# Patient Record
Sex: Female | Born: 1990 | Race: Black or African American | Hispanic: No | Marital: Single | Smoking: Current some day smoker
Health system: Southern US, Community
[De-identification: ages and names within clinical notes are randomized; demographics above are authoritative.]

## PROBLEM LIST (undated history)

## (undated) DIAGNOSIS — J45909 Unspecified asthma, uncomplicated: Secondary | ICD-10-CM

## (undated) DIAGNOSIS — N6019 Diffuse cystic mastopathy of unspecified breast: Secondary | ICD-10-CM

## (undated) DIAGNOSIS — L309 Dermatitis, unspecified: Secondary | ICD-10-CM

## (undated) DIAGNOSIS — A4902 Methicillin resistant Staphylococcus aureus infection, unspecified site: Secondary | ICD-10-CM

## (undated) HISTORY — PX: NO PAST SURGERIES: SHX2092

---

## 2012-01-15 ENCOUNTER — Emergency Department (HOSPITAL_BASED_OUTPATIENT_CLINIC_OR_DEPARTMENT_OTHER)
Admission: EM | Admit: 2012-01-15 | Discharge: 2012-01-15 | Disposition: A | Discharge status: Home / Self Care | Payer: Medicaid Other | Attending: Emergency Medicine | Admitting: Emergency Medicine

## 2012-01-15 ENCOUNTER — Encounter (HOSPITAL_BASED_OUTPATIENT_CLINIC_OR_DEPARTMENT_OTHER): Payer: Self-pay | Admitting: *Deleted

## 2012-01-15 DIAGNOSIS — N611 Abscess of the breast and nipple: Secondary | ICD-10-CM

## 2012-01-15 DIAGNOSIS — N61 Mastitis without abscess: Secondary | ICD-10-CM

## 2012-01-15 DIAGNOSIS — F17200 Nicotine dependence, unspecified, uncomplicated: Secondary | ICD-10-CM | POA: Insufficient documentation

## 2012-01-15 DIAGNOSIS — N644 Mastodynia: Secondary | ICD-10-CM | POA: Insufficient documentation

## 2012-01-15 DIAGNOSIS — Z8614 Personal history of Methicillin resistant Staphylococcus aureus infection: Secondary | ICD-10-CM | POA: Insufficient documentation

## 2012-01-15 HISTORY — DX: Methicillin resistant Staphylococcus aureus infection, unspecified site: A49.02

## 2012-01-15 HISTORY — DX: Unspecified asthma, uncomplicated: J45.909

## 2012-01-15 HISTORY — DX: Dermatitis, unspecified: L30.9

## 2012-01-15 MED ORDER — CEPHALEXIN 500 MG PO CAPS: 500.0000 mg | ORAL_CAPSULE | Freq: Four times a day (QID) | ORAL | Status: AC

## 2012-01-15 MED ORDER — DOXYCYCLINE HYCLATE 100 MG PO TABS
100.0000 mg | ORAL_TABLET | Freq: Once | ORAL | Status: AC
  Administered 2012-01-15: 100 mg via ORAL

## 2012-01-15 MED ORDER — DOXYCYCLINE HYCLATE 100 MG PO TABS
ORAL_TABLET | ORAL | Status: AC
  Administered 2012-01-15: 100 mg via ORAL
  Filled 2012-01-15: qty 1

## 2012-01-15 MED ORDER — DOXYCYCLINE HYCLATE 100 MG IV SOLR: 100.0000 mg | Freq: Once | INTRAVENOUS | Status: DC

## 2012-01-15 MED ORDER — CEPHALEXIN 250 MG PO CAPS
500.0000 mg | ORAL_CAPSULE | Freq: Once | ORAL | Status: AC
  Administered 2012-01-15: 500 mg via ORAL
  Filled 2012-01-15: qty 2

## 2012-01-15 MED ORDER — DOXYCYCLINE HYCLATE 100 MG PO CAPS: 100.0000 mg | ORAL_CAPSULE | Freq: Two times a day (BID) | ORAL | Status: AC

## 2012-01-15 MED ORDER — TRAMADOL HCL 50 MG PO TABS: 50.0000 mg | ORAL_TABLET | Freq: Four times a day (QID) | ORAL | Status: AC | PRN

## 2012-01-15 NOTE — ED Notes (Signed)
Pt c/o red swollen area to left breast with drainage, denies fever

## 2012-01-15 NOTE — ED Notes (Signed)
Pt called requesting follow up information. Information given.

## 2012-01-15 NOTE — ED Notes (Signed)
MD at bedside. 

## 2012-01-15 NOTE — ED Provider Notes (Addendum)
History     CSN: [id number]  Arrival date & time 01/15/12  0241   None     Chief Complaint  Patient presents with  . Abscess    (Consider location/radiation/quality/duration/timing/severity/associated sxs/prior treatment) The history is provided by the patient. No language interpreter was used.  Patient has had left breast pain and swelling starting yesterday.  She is not nursing.  No f/c/r.  No n/v/d.  Patient has had breast redness in the past.  No rashes but the breast is severely painful to the touch and is red at the nipple and around the areola  Past Medical History  Diagnosis Date  . Asthma   . Eczema   . MRSA (methicillin resistant Staphylococcus aureus)     History reviewed. No pertinent past surgical history.  History reviewed. No pertinent family history.  History  Substance Use Topics  . Smoking status: Current Everyday Smoker  . Smokeless tobacco: Not on file  . Alcohol Use: No    OB History    Grav Para Term Preterm Abortions TAB SAB Ect Mult Living                  Review of Systems  Constitutional: Negative for fever.  HENT: Negative for neck pain and neck stiffness.   All other systems reviewed and are negative.    Allergies  Sulfa antibiotics  Home Medications  No current outpatient prescriptions on file.  BP 109/68  Pulse 80  Temp(Src) 98 F (36.7 C) (Oral)  Resp 16  Ht 5\' 7"  (1.702 m)  Wt 130 lb (58.968 kg)  BMI 20.36 kg/m2  SpO2 100%  LMP 01/03/2012  Physical Exam  Constitutional: She is oriented to person, place, and time. She appears well-developed and well-nourished. No distress.  HENT:  Head: Normocephalic and atraumatic.  Mouth/Throat: Oropharynx is clear and moist.  Eyes: Conjunctivae are normal. Pupils are equal, round, and reactive to light.  Neck: Normal range of motion. Neck supple.       No lymphadenopathy of the supraclavicular area or axilla no epitrochlear nodes  Cardiovascular: Normal rate and regular rhythm.    Pulmonary/Chest: Effort normal and breath sounds normal. She has no wheezes. She has no rales. Left breast exhibits inverted nipple, skin change and tenderness.    Abdominal: Soft. Bowel sounds are normal. There is no tenderness.  Musculoskeletal: Normal range of motion.  Lymphadenopathy:    She has no cervical adenopathy.  Neurological: She is alert and oriented to person, place, and time.  Skin: Skin is warm and dry.  Psychiatric: She has a normal mood and affect.    ED Course  Procedures (including critical care time)  Labs Reviewed - No data to display No results found.   No diagnosis found.    MDM  Will place on antibiotics and have patient to follow up with surgery and breast center.  Patient will need imaging and drainage.  Patient and mother verbalize understanding and agree to follow up        Mahalie Kanner K Meiling Hendriks-Rasch, MD 01/15/12 0328  Nayzeth Altman K Elesha Thedford-Rasch, MD 01/15/12 [phone]

## 2012-01-18 ENCOUNTER — Other Ambulatory Visit: Payer: Self-pay | Admitting: *Deleted

## 2012-01-18 DIAGNOSIS — N644 Mastodynia: Secondary | ICD-10-CM

## 2012-01-18 DIAGNOSIS — N63 Unspecified lump in unspecified breast: Secondary | ICD-10-CM

## 2012-02-19 ENCOUNTER — Other Ambulatory Visit: Payer: Self-pay | Admitting: Emergency Medicine

## 2012-02-19 DIAGNOSIS — N63 Unspecified lump in unspecified breast: Secondary | ICD-10-CM

## 2012-02-19 DIAGNOSIS — N644 Mastodynia: Secondary | ICD-10-CM

## 2012-03-03 ENCOUNTER — Other Ambulatory Visit: Payer: Medicaid Other

## 2012-04-07 ENCOUNTER — Emergency Department (HOSPITAL_BASED_OUTPATIENT_CLINIC_OR_DEPARTMENT_OTHER)
Admission: EM | Admit: 2012-04-07 | Discharge: 2012-04-07 | Disposition: A | Discharge status: Home / Self Care | Payer: No Typology Code available for payment source | Attending: Emergency Medicine | Admitting: Emergency Medicine

## 2012-04-07 ENCOUNTER — Encounter (HOSPITAL_BASED_OUTPATIENT_CLINIC_OR_DEPARTMENT_OTHER): Payer: Self-pay | Admitting: Emergency Medicine

## 2012-04-07 DIAGNOSIS — F17200 Nicotine dependence, unspecified, uncomplicated: Secondary | ICD-10-CM | POA: Insufficient documentation

## 2012-04-07 DIAGNOSIS — J45909 Unspecified asthma, uncomplicated: Secondary | ICD-10-CM | POA: Insufficient documentation

## 2012-04-07 DIAGNOSIS — Z882 Allergy status to sulfonamides status: Secondary | ICD-10-CM | POA: Insufficient documentation

## 2012-04-07 DIAGNOSIS — S5010XA Contusion of unspecified forearm, initial encounter: Secondary | ICD-10-CM | POA: Insufficient documentation

## 2012-04-07 MED ORDER — CYCLOBENZAPRINE HCL 10 MG PO TABS: 10.0000 mg | ORAL_TABLET | Freq: Two times a day (BID) | ORAL | Status: AC | PRN
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
## 2012-04-07 NOTE — ED Notes (Signed)
Reviewed Rx for Flexeril and D/C instructions
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
## 2012-04-27 ENCOUNTER — Emergency Department (HOSPITAL_BASED_OUTPATIENT_CLINIC_OR_DEPARTMENT_OTHER)
Admission: EM | Admit: 2012-04-27 | Discharge: 2012-04-27 | Disposition: A | Discharge status: Home / Self Care | Payer: No Typology Code available for payment source | Attending: Emergency Medicine | Admitting: Emergency Medicine

## 2012-04-27 ENCOUNTER — Encounter (HOSPITAL_BASED_OUTPATIENT_CLINIC_OR_DEPARTMENT_OTHER): Payer: Self-pay | Admitting: Family Medicine

## 2012-04-27 ENCOUNTER — Emergency Department (HOSPITAL_BASED_OUTPATIENT_CLINIC_OR_DEPARTMENT_OTHER): Payer: No Typology Code available for payment source

## 2012-04-27 DIAGNOSIS — S63509A Unspecified sprain of unspecified wrist, initial encounter: Secondary | ICD-10-CM

## 2012-04-27 DIAGNOSIS — Z8614 Personal history of Methicillin resistant Staphylococcus aureus infection: Secondary | ICD-10-CM | POA: Insufficient documentation

## 2012-04-27 DIAGNOSIS — Z87828 Personal history of other (healed) physical injury and trauma: Secondary | ICD-10-CM | POA: Insufficient documentation

## 2012-04-27 DIAGNOSIS — F17200 Nicotine dependence, unspecified, uncomplicated: Secondary | ICD-10-CM | POA: Insufficient documentation

## 2012-04-27 NOTE — ED Provider Notes (Signed)
Medical screening examination/treatment/procedure(s) were performed by non-physician practitioner and as supervising physician I was immediately available for consultation/collaboration.  Ethelda Chick, MD 04/27/12 1247
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
## 2012-04-27 NOTE — ED Provider Notes (Signed)
Medical screening examination/treatment/procedure(s) were performed by non-physician practitioner and as supervising physician I was immediately available for consultation/collaboration.  Ethelda Chick, MD 04/27/12 [phone]

## 2012-04-27 NOTE — Discharge Instructions (Signed)
Wrist Sprain °with Rehab °A sprain is an injury in which a ligament that maintains the proper alignment of a joint is partially or completely torn. The ligaments of the wrist are susceptible to sprains. Sprains are classified into three categories. Grade 1 sprains cause pain, but the tendon is not lengthened. Grade 2 sprains include a lengthened ligament because the ligament is stretched or partially ruptured. With grade 2 sprains there is still function, although the function may be diminished. Grade 3 sprains are characterized by a complete tear of the tendon or muscle, and function is usually impaired. °SYMPTOMS  °· Pain tenderness, inflammation, and/or bruising (contusion) of the injury.  °· A "pop" or tear felt and/or heard at the time of injury.  °· Decreased wrist function.  °CAUSES  °A wrist sprain occurs when a force is placed on one or more ligaments that is greater than it/they can withstand. Common mechanisms of injury include: °· Catching a ball with you hands.  °· Repetitive and/ or strenuous extension or flexion of the wrist.  °RISK INCREASES WITH: °· Previous wrist injury.  °· Contact sports (boxing or wrestling).  °· Activities in which falling is common.  °· Poor strength and flexibility.  °· Improperly fitted or padded protective equipment.  °PREVENTION °· Warm up and stretch properly before activity.  °· Allow for adequate recovery between workouts.  °· Maintain physical fitness:  °· Strength, flexibility, and endurance.  °· Cardiovascular fitness.  °· Protect the wrist joint by limiting its motion with the use of taping, braces, or splints.  °· Protect the wrist after injury for 6 to 12 months.  °PROGNOSIS  °The prognosis for wrist sprains depends on the degree of injury. Grade 1 sprains require 2 to 6 weeks of treatment. Grade 2 sprains require 6 to 8 weeks of treatment, and grade 3 sprains require up to 12 weeks.  °RELATED COMPLICATIONS  °· Prolonged healing time, if improperly treated or  re-injured.  °· Recurrent symptoms that result in a chronic problem.  °· Injury to nearby structures (bone, cartilage, nerves, or tendons).  °· Arthritis of the wrist.  °· Inability to compete in athletics at a high level.  °· Wrist stiffness or weakness.  °· Progression to a complete rupture of the ligament.  °TREATMENT  °Treatment initially involves resting from any activities that aggravate the symptoms, and the use of ice and medications to help reduce pain and inflammation. Your caregiver may recommend immobilizing the wrist for a period of time in order to reduce stress on the ligament and allow for healing. After immobilization it is important to perform strengthening and stretching exercises to help regain strength and a full range of motion. These exercises may be completed at home or with a therapist. Surgery is not usually required for wrist sprains, unless the ligament has been ruptured (grade 3 sprain). °MEDICATION  °· If pain medication is necessary, then nonsteroidal anti-inflammatory medications, such as aspirin and ibuprofen, or other minor pain relievers, such as acetaminophen, are often recommended.  °· Do not take pain medication for 7 days before surgery.  °· Prescription pain relievers may be given if deemed necessary by your caregiver. Use only as directed and only as much as you need.  °HEAT AND COLD °· Cold treatment (icing) relieves pain and reduces inflammation. Cold treatment should be applied for 10 to 15 minutes every 2 to 3 hours for inflammation and pain and immediately after any activity that aggravates your symptoms. Use ice packs or   massage the area with a piece of ice (ice massage).  °· Heat treatment may be used prior to performing the stretching and strengthening activities prescribed by your caregiver, physical therapist, or athletic trainer. Use a heat pack or soak your injury in warm water.  °SEEK MEDICAL CARE IF: °· Treatment seems to offer no benefit, or the condition  worsens.  °· Any medications produce adverse side effects.  °EXERCISES °RANGE OF MOTION (ROM) AND STRETCHING EXERCISES - Wrist Sprain  °These exercises may help you when beginning to rehabilitate your injury. Your symptoms may resolve with or without further involvement from your physician, physical therapist or athletic trainer. While completing these exercises, remember:  °· Restoring tissue flexibility helps normal motion to return to the joints. This allows healthier, less painful movement and activity.  °· An effective stretch should be held for at least 30 seconds.  °· A stretch should never be painful. You should only feel a gentle lengthening or release in the stretched tissue.  °RANGE OF MOTION - Wrist Flexion, Active-Assisted °· Extend your right / left elbow with your fingers pointing down.*  °· Gently pull the back of your hand towards you until you feel a gentle stretch on the top of your forearm.  °· Hold this position for __________ seconds.  °Repeat __________ times. Complete this exercise __________ times per day.  °*If directed by your physician, physical therapist or athletic trainer, complete this stretch with your elbow bent rather than extended. °RANGE OF MOTION - Wrist Extension, Active-Assisted °· Extend your right / left elbow and turn your palm upwards.*  °· Gently pull your palm/fingertips back so your wrist extends and your fingers point more toward the ground.  °· You should feel a gentle stretch on the inside of your forearm.  °· Hold this position for __________ seconds.  °Repeat __________ times. Complete this exercise __________ times per day. °*If directed by your physician, physical therapist or athletic trainer, complete this stretch with your elbow bent, rather than extended. °RANGE OF MOTION - Supination, Active °· Stand or sit with your elbows at your side. Bend your right / left elbow to 90 degrees.  °· Turn your palm upward until you feel a gentle stretch on the inside of  your forearm.  °· Hold this position for __________ seconds. Slowly release and return to the starting position.  °Repeat __________ times. Complete this stretch __________ times per day.  °RANGE OF MOTION - Pronation, Active °· Stand or sit with your elbows at your side. Bend your right / left elbow to 90 degrees.  °· Turn your palm downward until you feel a gentle stretch on the top of your forearm.  °· Hold this position for __________ seconds. Slowly release and return to the starting position.  °Repeat __________ times. Complete this stretch __________ times per day.  °STRETCH - Wrist Flexion °· Place the back of your right / left hand on a tabletop leaving your elbow slightly bent. Your fingers should point away from your body.  °· Gently press the back of your hand down onto the table by straightening your elbow. You should feel a stretch on the top of your forearm.  °· Hold this position for __________ seconds.  °Repeat __________ times. Complete this stretch __________ times per day.  °STRETCH - Wrist Extension °· Place your right / left fingertips on a tabletop leaving your elbow slightly bent. Your fingers should point backwards.  °· Gently press your fingers and palm down onto   the table by straightening your elbow. You should feel a stretch on the inside of your forearm.  °· Hold this position for __________ seconds.  °Repeat __________ times. Complete this stretch __________ times per day.  °STRENGTHENING EXERCISES - Wrist Sprain °These exercises may help you when beginning to rehabilitate your injury. They may resolve your symptoms with or without further involvement from your physician, physical therapist or athletic trainer. While completing these exercises, remember:  °· Muscles can gain both the endurance and the strength needed for everyday activities through controlled exercises.  °· Complete these exercises as instructed by your physician, physical therapist or athletic trainer. Progress with  the resistance and repetition exercises only as your caregiver advises.  °STRENGTH - Wrist Flexors °· Sit with your right / left forearm palm-up and fully supported. Your elbow should be resting below the height of your shoulder. Allow your wrist to extend over the edge of the surface.  °· Loosely holding a __________ weight or a piece of rubber exercise band/tubing, slowly curl your hand up toward your forearm.  °· Hold this position for __________ seconds. Slowly lower the wrist back to the starting position in a controlled manner.  °Repeat __________ times. Complete this exercise __________ times per day.  °STRENGTH - Wrist Extensors °· Sit with your right / left forearm palm-down and fully supported. Your elbow should be resting below the height of your shoulder. Allow your wrist to extend over the edge of the surface.  °· Loosely holding a __________ weight or a piece of rubber exercise band/tubing, slowly curl your hand up toward your forearm.  °· Hold this position for __________ seconds. Slowly lower the wrist back to the starting position in a controlled manner.  °Repeat __________ times. Complete this exercise __________ times per day.  °STRENGTH - Ulnar Deviators °· Stand with a ____________________ weight in your right / left hand, or sit holding on to the rubber exercise band/tubing with your opposite arm supported.  °· Move your wrist so that your pinkie travels toward your forearm and your thumb moves away from your forearm.  °· Hold this position for __________ seconds and then slowly lower the wrist back to the starting position.  °Repeat __________ times. Complete this exercise __________ times per day °STRENGTH - Radial Deviators °· Stand with a ____________________ weight in your  °· right / left hand, or sit holding on to the rubber exercise band/tubing with your arm supported.  °· Raise your hand upward in front of you or pull up on the rubber tubing.  °· Hold this position for __________  seconds and then slowly lower the wrist back to the starting position.  °Repeat __________ times. Complete this exercise __________ times per day. °STRENGTH - Forearm Supinators °· Sit with your right / left forearm supported on a table, keeping your elbow below shoulder height. Rest your hand over the edge, palm down.  °· Gently grip a hammer or a soup ladle.  °· Without moving your elbow, slowly turn your palm and hand upward to a "thumbs-up" position.  °· Hold this position for __________ seconds. Slowly return to the starting position.  °Repeat __________ times. Complete this exercise __________ times per day.  °STRENGTH - Forearm Pronators °· Sit with your right / left forearm supported on a table, keeping your elbow below shoulder height. Rest your hand over the edge, palm up.  °· Gently grip a hammer or a soup ladle.  °· Without moving your elbow, slowly turn   your palm and hand upward to a "thumbs-up" position.  °· Hold this position for __________ seconds. Slowly return to the starting position.  °Repeat __________ times. Complete this exercise __________ times per day.  °STRENGTH - Grip °· Grasp a tennis ball, a dense sponge, or a large, rolled sock in your hand.  °· Squeeze as hard as you can without increasing any pain.  °· Hold this position for __________ seconds. Release your grip slowly.  °Repeat __________ times. Complete this exercise __________ times per day.  °Document Released: 07/27/2005 Document Revised: 07/16/2011 Document Reviewed: 11/08/2008 °[employer]® Patient Information ©2012 [employer]. °
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
## 2012-11-01 ENCOUNTER — Emergency Department (HOSPITAL_BASED_OUTPATIENT_CLINIC_OR_DEPARTMENT_OTHER)
Admission: EM | Admit: 2012-11-01 | Discharge: 2012-11-01 | Disposition: A | Discharge status: Home / Self Care | Payer: No Typology Code available for payment source | Attending: Emergency Medicine | Admitting: Emergency Medicine

## 2012-11-01 ENCOUNTER — Encounter (HOSPITAL_BASED_OUTPATIENT_CLINIC_OR_DEPARTMENT_OTHER): Payer: Self-pay | Admitting: Emergency Medicine

## 2012-11-01 ENCOUNTER — Emergency Department (HOSPITAL_BASED_OUTPATIENT_CLINIC_OR_DEPARTMENT_OTHER): Payer: No Typology Code available for payment source

## 2012-11-01 DIAGNOSIS — S0083XA Contusion of other part of head, initial encounter: Secondary | ICD-10-CM

## 2012-11-01 DIAGNOSIS — Z872 Personal history of diseases of the skin and subcutaneous tissue: Secondary | ICD-10-CM | POA: Insufficient documentation

## 2012-11-01 DIAGNOSIS — Y92410 Unspecified street and highway as the place of occurrence of the external cause: Secondary | ICD-10-CM | POA: Insufficient documentation

## 2012-11-01 DIAGNOSIS — J45909 Unspecified asthma, uncomplicated: Secondary | ICD-10-CM | POA: Insufficient documentation

## 2012-11-01 DIAGNOSIS — F17200 Nicotine dependence, unspecified, uncomplicated: Secondary | ICD-10-CM | POA: Insufficient documentation

## 2012-11-01 DIAGNOSIS — Z8614 Personal history of Methicillin resistant Staphylococcus aureus infection: Secondary | ICD-10-CM | POA: Insufficient documentation

## 2012-11-01 DIAGNOSIS — Z79899 Other long term (current) drug therapy: Secondary | ICD-10-CM | POA: Insufficient documentation

## 2012-11-01 DIAGNOSIS — S0003XA Contusion of scalp, initial encounter: Secondary | ICD-10-CM | POA: Insufficient documentation

## 2012-11-01 DIAGNOSIS — Y9389 Activity, other specified: Secondary | ICD-10-CM | POA: Insufficient documentation

## 2012-11-01 MED ORDER — HYDROCODONE-ACETAMINOPHEN 5-325 MG PO TABS: 2.0000 | ORAL_TABLET | ORAL | Status: DC | PRN

## 2012-11-01 NOTE — ED Notes (Signed)
MD at bedside. 
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
## 2012-11-01 NOTE — ED Notes (Signed)
Patient transported to X-ray via stretcher 
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
## 2013-03-02 ENCOUNTER — Emergency Department (HOSPITAL_BASED_OUTPATIENT_CLINIC_OR_DEPARTMENT_OTHER)
Admission: EM | Admit: 2013-03-02 | Discharge: 2013-03-02 | Disposition: A | Discharge status: Home / Self Care | Payer: Self-pay | Attending: Emergency Medicine | Admitting: Emergency Medicine

## 2013-03-02 ENCOUNTER — Encounter (HOSPITAL_BASED_OUTPATIENT_CLINIC_OR_DEPARTMENT_OTHER): Payer: Self-pay

## 2013-03-02 DIAGNOSIS — Z87891 Personal history of nicotine dependence: Secondary | ICD-10-CM | POA: Insufficient documentation

## 2013-03-02 DIAGNOSIS — Z872 Personal history of diseases of the skin and subcutaneous tissue: Secondary | ICD-10-CM | POA: Insufficient documentation

## 2013-03-02 DIAGNOSIS — Z8614 Personal history of Methicillin resistant Staphylococcus aureus infection: Secondary | ICD-10-CM | POA: Insufficient documentation

## 2013-03-02 DIAGNOSIS — B001 Herpesviral vesicular dermatitis: Secondary | ICD-10-CM

## 2013-03-02 DIAGNOSIS — B009 Herpesviral infection, unspecified: Secondary | ICD-10-CM | POA: Insufficient documentation

## 2013-03-02 DIAGNOSIS — J45909 Unspecified asthma, uncomplicated: Secondary | ICD-10-CM | POA: Insufficient documentation

## 2013-03-02 MED ORDER — VALACYCLOVIR HCL 1 G PO TABS: 2000.0000 mg | ORAL_TABLET | Freq: Two times a day (BID) | ORAL | Status: AC

## 2013-03-02 NOTE — ED Notes (Signed)
C/o "cold sore" to lower lip x 2-3 days

## 2013-03-02 NOTE — ED Provider Notes (Signed)
   History    CSN: [id number] Arrival date & time 03/02/13  1446  First MD Initiated Contact with Patient 03/02/13 1503     Chief Complaint  Patient presents with  . Mouth Lesions   (Consider location/radiation/quality/duration/timing/severity/associated sxs/prior Treatment) HPI Comments: Patient presents for evaluation of a cold sore on her lower lip. Sores are started 2 or 3 days ago. Patient reports that she has never had a similar lesion before. No sores inside the mouth or sore throat. Has not had any concomitant illness.  Patient is a 22 y.o. female presenting with mouth sores.  Mouth Lesions  Past Medical History  Diagnosis Date  . Asthma   . Eczema   . MRSA (methicillin resistant Staphylococcus aureus)    History reviewed. No pertinent past surgical history. No family history on file. History  Substance Use Topics  . Smoking status: Former Games developer  . Smokeless tobacco: Not on file  . Alcohol Use: No   OB History   Grav Para Term Preterm Abortions TAB SAB Ect Mult Living                 Review of Systems  HENT: Positive for mouth sores.   Respiratory: Negative.     Allergies  Sulfa antibiotics  Home Medications   Current Outpatient Rx  Name  Route  Sig  Dispense  Refill  . albuterol (PROVENTIL HFA;VENTOLIN HFA) 108 (90 BASE) MCG/ACT inhaler   Inhalation   Inhale 2 puffs into the lungs every 6 (six) hours as needed for wheezing.         . Cyclobenzaprine HCl (FLEXERIL PO)   Oral   Take by mouth.         . etonogestrel (IMPLANON) 68 MG IMPL implant   Subcutaneous   Inject 1 each into the skin once.         Marland Kitchen HYDROcodone-acetaminophen (NORCO/VICODIN) 5-325 MG per tablet   Oral   Take 2 tablets by mouth every 4 (four) hours as needed for pain.   12 tablet   0   . ibuprofen (ADVIL,MOTRIN) 200 MG tablet   Oral   Take 200 mg by mouth every 6 (six) hours as needed. For pain          BP 126/56  Pulse 118  Temp(Src) 99.9 F (37.7 C) (Oral)   Resp 18  Ht 5\' 8"  (1.727 m)  Wt 140 lb (63.504 kg)  BMI 21.29 kg/m2  SpO2 99%  LMP 01/18/2013 Physical Exam  Constitutional: She appears well-developed.  HENT:  Head: Normocephalic.  Mouth/Throat:    Neck: Neck supple.  Cardiovascular: S1 normal and S2 normal.   Pulmonary/Chest: Effort normal and breath sounds normal.  Lymphadenopathy:    She has no cervical adenopathy.  Skin:       ED Course  Procedures (including critical care time) Labs Reviewed - No data to display No results found.  Diagnosis: Herpes Labialis  MDM  Exam c/w oral herpes  Gilda Crease, MD 03/02/13 1513

## 2014-01-21 ENCOUNTER — Emergency Department (HOSPITAL_BASED_OUTPATIENT_CLINIC_OR_DEPARTMENT_OTHER)
Admission: EM | Admit: 2014-01-21 | Discharge: 2014-01-21 | Disposition: A | Discharge status: Home / Self Care | Payer: Medicaid Other | Attending: Emergency Medicine | Admitting: Emergency Medicine

## 2014-01-21 ENCOUNTER — Encounter (HOSPITAL_BASED_OUTPATIENT_CLINIC_OR_DEPARTMENT_OTHER): Payer: Self-pay | Admitting: Emergency Medicine

## 2014-01-21 DIAGNOSIS — N63 Unspecified lump in unspecified breast: Secondary | ICD-10-CM | POA: Insufficient documentation

## 2014-01-21 DIAGNOSIS — R599 Enlarged lymph nodes, unspecified: Secondary | ICD-10-CM | POA: Insufficient documentation

## 2014-01-21 DIAGNOSIS — Z3202 Encounter for pregnancy test, result negative: Secondary | ICD-10-CM | POA: Insufficient documentation

## 2014-01-21 DIAGNOSIS — Z872 Personal history of diseases of the skin and subcutaneous tissue: Secondary | ICD-10-CM | POA: Insufficient documentation

## 2014-01-21 DIAGNOSIS — Z8614 Personal history of Methicillin resistant Staphylococcus aureus infection: Secondary | ICD-10-CM | POA: Insufficient documentation

## 2014-01-21 DIAGNOSIS — N6310 Unspecified lump in the right breast, unspecified quadrant: Secondary | ICD-10-CM

## 2014-01-21 DIAGNOSIS — Z87891 Personal history of nicotine dependence: Secondary | ICD-10-CM | POA: Insufficient documentation

## 2014-01-21 DIAGNOSIS — J45909 Unspecified asthma, uncomplicated: Secondary | ICD-10-CM | POA: Insufficient documentation

## 2014-01-21 DIAGNOSIS — Z79899 Other long term (current) drug therapy: Secondary | ICD-10-CM | POA: Insufficient documentation

## 2014-01-21 DIAGNOSIS — R591 Generalized enlarged lymph nodes: Secondary | ICD-10-CM

## 2014-01-21 LAB — PREGNANCY, URINE: Preg Test, Ur: NEGATIVE

## 2014-01-21 MED ORDER — DOXYCYCLINE HYCLATE 50 MG PO CAPS: 50.0000 mg | ORAL_CAPSULE | Freq: Two times a day (BID) | ORAL | Status: DC

## 2014-01-21 MED ORDER — DOXYCYCLINE HYCLATE 100 MG PO TABS
100.0000 mg | ORAL_TABLET | Freq: Once | ORAL | Status: AC
  Administered 2014-01-21: 100 mg via ORAL
  Filled 2014-01-21: qty 1

## 2014-01-21 MED ORDER — IBUPROFEN 800 MG PO TABS
800.0000 mg | ORAL_TABLET | Freq: Once | ORAL | Status: AC
  Administered 2014-01-21: 800 mg via ORAL
  Filled 2014-01-21: qty 1

## 2014-01-21 MED ORDER — IBUPROFEN 800 MG PO TABS: 800.0000 mg | ORAL_TABLET | Freq: Three times a day (TID) | ORAL | Status: AC

## 2014-01-21 NOTE — ED Notes (Addendum)
C/o right breast pain that started a couple days ago (around areola)  Denies any drainage from nipple but states right breast has felt warm to touch and swollen.  States pain comes and goes.  Denies any fevers. Pt states mom and grandmother have a hx of breast cancer. Redness and swelling noted to right breast, tender to touch.

## 2014-01-21 NOTE — ED Provider Notes (Signed)
CSN: [id number]     Arrival date & time 01/21/14  [id number] History   First MD Initiated Contact with Patient 01/21/14 0404     Chief Complaint  Patient presents with  . right breast pain      (Consider location/radiation/quality/duration/timing/severity/associated sxs/prior Treatment) The history is provided by the patient.  Patient breasts with a several day history of right breast pain, now redness.  No nipple inversion.  No discharge, no bleeding.  No trauma.  Not breast feeding.  Has fibrocysts in opposite breasts.  No mammogram ever on right breast.  No f/c/r.    Past Medical History  Diagnosis Date  . Asthma   . Eczema   . MRSA (methicillin resistant Staphylococcus aureus)    History reviewed. No pertinent past surgical history. No family history on file. History  Substance Use Topics  . Smoking status: Former Games developermoker  . Smokeless tobacco: Not on file  . Alcohol Use: No   OB History   Grav Para Term Preterm Abortions TAB SAB Ect Mult Living                 Review of Systems  Constitutional: Negative for fever.  Cardiovascular: Negative for chest pain.  All other systems reviewed and are negative.     Allergies  Sulfa antibiotics  Home Medications   Prior to Admission medications   Medication Sig Start Date End Date Taking? Authorizing Provider  albuterol (PROVENTIL HFA;VENTOLIN HFA) 108 (90 BASE) MCG/ACT inhaler Inhale 2 puffs into the lungs every 6 (six) hours as needed for wheezing.    Historical Provider, MD  Cyclobenzaprine HCl (FLEXERIL PO) Take by mouth.    Historical Provider, MD  etonogestrel (IMPLANON) 68 MG IMPL implant Inject 1 each into the skin once.    Historical Provider, MD  HYDROcodone-acetaminophen (NORCO/VICODIN) 5-325 MG per tablet Take 2 tablets by mouth every 4 (four) hours as needed for pain. 11/01/12   Toy BakerAnthony T Allen, MD  ibuprofen (ADVIL,MOTRIN) 200 MG tablet Take 200 mg by mouth every 6 (six) hours as needed. For pain    Historical  Provider, MD   BP 106/70  Pulse 61  Temp(Src) 97.8 F (36.6 C)  Resp 18  Ht 5\' 7"  (1.702 m)  Wt 132 lb (59.875 kg)  BMI 20.67 kg/m2  SpO2 100%  LMP 01/13/2014 Physical Exam  Constitutional: She is oriented to person, place, and time. She appears well-developed and well-nourished. No distress.  HENT:  Head: Normocephalic and atraumatic.  Mouth/Throat: Oropharynx is clear and moist.  Eyes: Conjunctivae are normal. Pupils are equal, round, and reactive to light.  Neck: Normal range of motion. Neck supple.  No cervical LAN.  No left axillary LAN.  No supraclavicular LAN.  Isolated lymph node in right axilla enlarged but freely mobile  Cardiovascular: Normal rate, regular rhythm and intact distal pulses.   Pulmonary/Chest: Effort normal and breath sounds normal. She has no wheezes. She has no rales. She exhibits no bony tenderness and no retraction. Right breast exhibits mass, skin change and tenderness. Right breast exhibits no inverted nipple and no nipple discharge. Left breast exhibits no inverted nipple, no mass, no nipple discharge, no skin change and no tenderness.    Erythema/warmth circumferential around nipple, no fluctuance,  on right no peau d'orange.    Abdominal: Soft. Bowel sounds are normal. There is no tenderness. There is no rebound and no guarding.  Musculoskeletal: Normal range of motion. She exhibits no edema and no tenderness.  Neurological: She is  alert and oriented to person, place, and time.  Skin: Skin is warm and dry.  Psychiatric: She has a normal mood and affect.    ED Course  Procedures (including critical care time) Labs Review Labs Reviewed  PREGNANCY, URINE    Imaging Review No results found.   EKG Interpretation None      MDM   Final diagnoses:  None  DDX:  1. Mastitis 2. Deep breast abscess with cellulitis 3. Blocked milk duct 4. Breast malignancy.     Breast redness with lump posterior to the nipple and enlarged lymph node in  the right axilla.  Will treat with antibiotics as there is erythema and warmth surrounding the nipple.  Have also advised warm moist compresses but will need breast imaging and following up first of this week with a breast surgeon, call first thing Monday morning to be seen.  Explained that patient needs to be seen immediately as malignancy needs to be excluded and patient needs am mammogram and possibly ultrasound and evaluation by a breast surgeon.  It is imperative you follow up immediately and take all antibiotics.  Patient and partner verbalized understanding and agree to follow up.  Given patient's h/o of MRSA will prescribe doxycycline and Ibuprofen   Heather Theissen K Naturi Alarid-Rasch, MD 01/21/14 [phone]

## 2014-02-04 ENCOUNTER — Inpatient Hospital Stay (HOSPITAL_COMMUNITY)
Admission: AD | Admit: 2014-02-04 | Discharge: 2014-02-04 | Disposition: A | Discharge status: Home / Self Care | Payer: Medicaid Other | Source: Ambulatory Visit | Attending: Obstetrics & Gynecology | Admitting: Obstetrics & Gynecology

## 2014-02-04 ENCOUNTER — Encounter (HOSPITAL_COMMUNITY): Payer: Self-pay | Admitting: *Deleted

## 2014-02-04 DIAGNOSIS — Z87891 Personal history of nicotine dependence: Secondary | ICD-10-CM | POA: Insufficient documentation

## 2014-02-04 DIAGNOSIS — J45909 Unspecified asthma, uncomplicated: Secondary | ICD-10-CM | POA: Insufficient documentation

## 2014-02-04 DIAGNOSIS — N63 Unspecified lump in unspecified breast: Secondary | ICD-10-CM | POA: Insufficient documentation

## 2014-02-04 DIAGNOSIS — Z882 Allergy status to sulfonamides status: Secondary | ICD-10-CM | POA: Insufficient documentation

## 2014-02-04 DIAGNOSIS — L259 Unspecified contact dermatitis, unspecified cause: Secondary | ICD-10-CM | POA: Insufficient documentation

## 2014-02-04 DIAGNOSIS — N6310 Unspecified lump in the right breast, unspecified quadrant: Secondary | ICD-10-CM

## 2014-02-04 HISTORY — DX: Diffuse cystic mastopathy of unspecified breast: N60.19

## 2014-02-04 LAB — URINALYSIS, ROUTINE W REFLEX MICROSCOPIC
Bilirubin Urine: NEGATIVE
Glucose, UA: NEGATIVE mg/dL
HGB URINE DIPSTICK: NEGATIVE
KETONES UR: NEGATIVE mg/dL
Leukocytes, UA: NEGATIVE
Nitrite: NEGATIVE
PROTEIN: NEGATIVE mg/dL
Specific Gravity, Urine: 1.015 (ref 1.005–1.030)
UROBILINOGEN UA: 0.2 mg/dL (ref 0.0–1.0)
pH: 6 (ref 5.0–8.0)

## 2014-02-04 LAB — POCT PREGNANCY, URINE: PREG TEST UR: NEGATIVE

## 2014-02-04 NOTE — MAU Provider Note (Signed)
Attestation of Attending Supervision of Advanced Practitioner (CNM/NP): Evaluation and management procedures were performed by the Advanced Practitioner under my supervision and collaboration.  I have reviewed the Advanced Practitioner's note and chart, and I agree with the management and plan.  HARRAWAY-SMITH, Markie Heffernan 3:57 PM

## 2014-02-04 NOTE — Discharge Instructions (Signed)
Breast Cyst A breast cyst is a sac in the breast that is filled with fluid. Breast cysts are common in women. Women can have one or many cysts. When the breasts contain many cysts, it is usually due to a noncancerous (benign) condition called fibrocystic change. These lumps form under the influence of female hormones (estrogen and progesterone). The lumps are most often located in the upper, outer portion of the breast. They are often more swollen, painful, and tender before your period starts. They usually disappear after menopause, unless you are on hormone therapy.  There are several types of cysts:  Macrocyst. This is a cyst that is about 2 in. (5.1 cm) in diameter.   Microcyst. This is a tiny cyst that you cannot feel but can be seen with a mammogram or an ultrasound.   Galactocele. This is a cyst containing milk that may develop if you suddenly stop breastfeeding.   Sebaceous cyst of the skin. This type of cyst is not in the breast tissue itself. Breast cysts do not increase your risk of breast cancer. However, they must be monitored closely because they can be cancerous.  CAUSES  It is not known exactly what causes a breast cyst to form. Possible causes include:  An overgrowth of milk glands and connective tissue in the breast can block the milk glands, causing them to fill with fluid.   Scar tissue in the breast from previous surgery may block the glands, causing a cyst.  RISK FACTORS Estrogen may influence the development of a breast cyst.  SIGNS AND SYMPTOMS   Feeling a smooth, round, soft lump (like a grape) in the breast that is easily moveable.   Breast discomfort or pain.  Increase in size of the lump before your menstrual period and decrease in its size after your menstrual period.  DIAGNOSIS  A cyst can be felt during a physical exam by your health care provider. A breast X-ray exam (mammogram) and ultrasonography will be done to confirm the diagnosis. Fluid may  be removed from the cyst with a needle (fine needle aspiration) to make sure the cyst is not cancerous.  TREATMENT  Treatment may not be necessary. Your health care provider may monitor the cyst to see if it goes away on its own. If treatment is needed, it may include:  Hormone treatment.   Needle aspiration. There is a chance of the cyst coming back after aspiration.   Surgery to remove the whole cyst.  HOME CARE INSTRUCTIONS   Keep all follow-up appointments with your health care provider.  See your health care provider regularly:  Get a yearly exam by your health care provider.  Have a clinical breast exam by a health care provider every 1-3 years if you are 4-49 years of age. After age 70 years, you should have the exam every year.   Get mammogram tests as directed by your health care provider.   Understand the normal appearance and feel of your breasts and perform breast self-exams.   Only take over-the-counter or prescription medicines as directed by your health care provider.   Wear a supportive bra, especially when exercising.   Avoid caffeine.   Reduce your salt intake, especially before your menstrual period. Too much salt can cause fluid retention, breast swelling, and discomfort.  SEEK MEDICAL CARE IF:   You feel, or think you feel, a lump in your breast.   You notice that both breasts look or feel different than usual.   Your  breast is still causing pain after your menstrual period is over.   You need medicine for breast pain and swelling that occurs with your menstrual period.  SEEK IMMEDIATE MEDICAL CARE IF:   You have severe pain, tenderness, redness, or warmth in your breast.   You have nipple discharge or bleeding.   Your breast lump becomes hard and painful.   You find new lumps or bumps that were not there before.   You feel lumps in your armpit (axilla).   You notice dimpling or wrinkling of the breast or nipple.   You  have a fever.  MAKE SURE YOU:  Understand these instructions.  Will watch your condition.  Will get help right away if you are not doing well or get worse. Document Released: 07/27/2005 Document Revised: 03/29/2013 Document Reviewed: 02/23/2013 [facility] Patient Information 2015 [employer], [place]. This information is not intended to replace advice given to you by your health care provider. Make sure you discuss any questions you have with your health care provider.  Breast Biopsy A breast biopsy is a test during which a sample of tissue is taken from your breast. The breast tissue is looked at under a microscope for cancer cells.  BEFORE THE PROCEDURE  Make plans to have someone drive you home after the test.  Do not smoke for 2 weeks before the test. Stop smoking, if you smoke.  Do not drink alcohol for 24 hours before the test.  Wear a good support bra to the test. PROCEDURE  You may be given one of the following:  A medicine to numb the breast area (local anesthesia).  A medicine to make you sleep (general anesthesia). There are different types of breast biopsies. They include:  Fine-needle aspiration.  A needle is put into the breast lump.  The needle takes out fluid and cells from the lump.  Ultrasound imaging may be used to help find the lump and to put the needle it the right spot.  Core-needle biopsy.  A needle is put into the breast lump.  The needle is put in your breast 3-6 times.  The needle removes breast tissue.  An ultrasound image or X-ray is often used to find the right spot to put in the needle.  Stereotactic biopsy.  X-rays and a computer are used to study X-ray pictures of the breast lump.  The computer finds where the needle needs to be put into the breast.  Tissue samples are taken out.  Vacuum-assisted biopsy.  A small cut (incision) is made in your breast.  A biopsy device is put through the cut and into the breast tissue.  The  biopsy device draws abnormal breast tissue into the biopsy device.  A large tissue sample is often removed.  No stitches are needed.  Ultrasound-guided core-needle biopsy.  Ultrasound imaging helps guide the needle into the area of the breast that is not normal.  A cut is made in the breast. The needle is put in the needle.  Tissue samples are taken out.  Open biopsy.  A large cut is made in the breast.  Your doctor will try to remove the whole breast lump or as much as possible. All tissue, fluid, or cell samples are looked at under a microscope.  AFTER THE PROCEDURE  You will be taken to an area to recover. You will be able to go home once you are doing well and are without problems.  You may have bruising on your breast. This is  normal.  A pressure bandage (dressing) may be put on your breast for 24-8 hours. This type of bandage is wrapped tightly around your chest. It helps stop fluid from building up underneath tissues. Document Released: 10/19/2011 Document Reviewed: 10/19/2011 [facility] Patient Information 2015 [employer], [place]. This information is not intended to replace advice given to you by your health care provider. Make sure you discuss any questions you have with your health care provider.

## 2014-02-04 NOTE — MAU Note (Signed)
Pain in rt breast, lump in breast first noted about 2 wks ago.  Lump is larger now. Was at [facility] 2 wks ago, mass with ? lymphnode

## 2014-02-04 NOTE — MAU Note (Signed)
Pt presents to MAU with complaints of pain in her right breast. Right breast hard to touch. She [facility] and a half [facility] and was told she had to go to her primary care physician to be referred to the breast center.

## 2014-02-04 NOTE — MAU Provider Note (Signed)
History     CSN: [id number]  Arrival date and time: 02/04/14 1337   First Provider Initiated Contact with Patient 02/04/14 1525      Chief Complaint  Patient presents with  . Breast Pain   HPI Comments: Heather Clayton 23 y.o. G0P0 presents to MAU with right breast pain, swelling and tenderness that has been ongoing for 2 months. [facility] and was given doxycycline which she did not feel was helpful. She is allergic to Sulfa and has had MRSA in past.  At that time she was told to go to Breast center and she tried but they told her she had to have a referral from her PCP. [place] [facility] and this would just be a greater expense. She has a history of fibrocyst in her breast bilaterally.      Past Medical History  Diagnosis Date  . Asthma   . Eczema   . MRSA (methicillin resistant Staphylococcus aureus)   . Fibrocystic breast     Past Surgical History  Procedure Laterality Date  . No past surgeries      History reviewed. No pertinent family history.  History  Substance Use Topics  . Smoking status: Former Smoker    Quit date: 10/07/2013  . Smokeless tobacco: Not on file  . Alcohol Use: No    Allergies:  Allergies  Allergen Reactions  . Sulfa Antibiotics Itching    Prescriptions prior to admission  Medication Sig Dispense Refill  . doxycycline (VIBRAMYCIN) 50 MG capsule Take 1 capsule (50 mg total) by mouth 2 (two) times daily.  14 capsule  0  . ibuprofen (ADVIL,MOTRIN) 200 MG tablet Take 400 mg by mouth every 6 (six) hours as needed. For pain      . ibuprofen (ADVIL,MOTRIN) 800 MG tablet Take 1 tablet (800 mg total) by mouth 3 (three) times daily.  21 tablet  0  . albuterol (PROVENTIL HFA;VENTOLIN HFA) 108 (90 BASE) MCG/ACT inhaler Inhale 2 puffs into the lungs every 6 (six) hours as needed for wheezing.        Review of Systems  Constitutional: Negative.  Negative for fever.  HENT:  Negative.   Eyes: Negative.   Cardiovascular: Negative.   Gastrointestinal: Negative.   Genitourinary: Negative.   Musculoskeletal: Negative.   Skin: Negative.        Right breast pain, swelling, redness  Neurological: Negative.   Psychiatric/Behavioral: Negative.    Physical Exam   Blood pressure 116/66, pulse 49, temperature 99.1 F (37.3 C), resp. rate 18, height 5\' 7"  (1.702 m), weight 51.256 kg (113 lb), last menstrual period 01/13/2014.  Physical Exam  Constitutional: She is oriented to person, place, and time. She appears well-developed and well-nourished. No distress.  HENT:  Head: Normocephalic and atraumatic.  Eyes: Pupils are equal, round, and reactive to light.  Respiratory: She exhibits mass, tenderness and swelling. Right breast exhibits mass and tenderness. Right breast exhibits no inverted nipple, no nipple discharge and no skin change. Breasts are asymmetrical.    Right breast mass with redness, tenderness and swelling  Musculoskeletal: Normal range of motion.  Neurological: She is alert and oriented to person, place, and time.  Skin: Skin is warm.  Psychiatric: She has a normal mood and affect. Judgment and thought content normal.    MAU Course  Procedures  MDM Called Breast Center leaving message to call patient for an appointment asap Pt advised  to call in am for appointment  Assessment and Plan  A: Right breast mass/ unresponsive to antibiotics  P: [facility] She will call back to MAU if unable to make arrangements  Carolynn ServeBarefoot, Avia Merkley Miller 02/04/2014, 3:44 PM

## 2014-02-05 ENCOUNTER — Other Ambulatory Visit: Payer: Self-pay | Admitting: Nurse Practitioner

## 2014-02-05 DIAGNOSIS — N644 Mastodynia: Secondary | ICD-10-CM

## 2014-02-05 DIAGNOSIS — N63 Unspecified lump in unspecified breast: Secondary | ICD-10-CM

## 2014-02-07 HISTORY — PX: BREAST CYST INCISION AND DRAINAGE: SHX14

## 2014-02-08 ENCOUNTER — Ambulatory Visit
Admission: RE | Admit: 2014-02-08 | Discharge: 2014-02-08 | Disposition: A | Discharge status: Home / Self Care | Payer: Medicaid Other | Source: Ambulatory Visit | Attending: Nurse Practitioner | Admitting: Nurse Practitioner

## 2014-02-08 ENCOUNTER — Other Ambulatory Visit: Payer: Self-pay | Admitting: Nurse Practitioner

## 2014-02-08 ENCOUNTER — Other Ambulatory Visit: Payer: Medicaid Other

## 2014-02-08 DIAGNOSIS — N644 Mastodynia: Secondary | ICD-10-CM

## 2014-02-08 DIAGNOSIS — N63 Unspecified lump in unspecified breast: Secondary | ICD-10-CM

## 2014-02-11 LAB — CULTURE, ROUTINE-ABSCESS

## 2014-02-22 ENCOUNTER — Other Ambulatory Visit: Payer: Self-pay

## 2014-02-22 ENCOUNTER — Encounter (INDEPENDENT_AMBULATORY_CARE_PROVIDER_SITE_OTHER): Payer: Self-pay

## 2014-02-22 ENCOUNTER — Ambulatory Visit
Admission: RE | Admit: 2014-02-22 | Discharge: 2014-02-22 | Disposition: A | Discharge status: Home / Self Care | Payer: Medicaid Other | Source: Ambulatory Visit | Attending: Nurse Practitioner | Admitting: Nurse Practitioner

## 2014-02-22 DIAGNOSIS — N644 Mastodynia: Secondary | ICD-10-CM

## 2014-02-22 DIAGNOSIS — N63 Unspecified lump in unspecified breast: Secondary | ICD-10-CM

## 2014-06-06 ENCOUNTER — Encounter (HOSPITAL_BASED_OUTPATIENT_CLINIC_OR_DEPARTMENT_OTHER): Payer: Self-pay | Admitting: Emergency Medicine

## 2014-06-06 ENCOUNTER — Emergency Department (HOSPITAL_BASED_OUTPATIENT_CLINIC_OR_DEPARTMENT_OTHER)
Admission: EM | Admit: 2014-06-06 | Discharge: 2014-06-06 | Disposition: A | Discharge status: Home / Self Care | Payer: Medicaid Other | Attending: Emergency Medicine | Admitting: Emergency Medicine

## 2014-06-06 DIAGNOSIS — Z8614 Personal history of Methicillin resistant Staphylococcus aureus infection: Secondary | ICD-10-CM | POA: Insufficient documentation

## 2014-06-06 DIAGNOSIS — Z872 Personal history of diseases of the skin and subcutaneous tissue: Secondary | ICD-10-CM | POA: Insufficient documentation

## 2014-06-06 DIAGNOSIS — Z720 Tobacco use: Secondary | ICD-10-CM | POA: Insufficient documentation

## 2014-06-06 DIAGNOSIS — N61 Inflammatory disorders of breast: Secondary | ICD-10-CM | POA: Insufficient documentation

## 2014-06-06 DIAGNOSIS — Z7982 Long term (current) use of aspirin: Secondary | ICD-10-CM | POA: Insufficient documentation

## 2014-06-06 DIAGNOSIS — J45909 Unspecified asthma, uncomplicated: Secondary | ICD-10-CM | POA: Insufficient documentation

## 2014-06-06 DIAGNOSIS — N611 Abscess of the breast and nipple: Secondary | ICD-10-CM

## 2014-06-06 DIAGNOSIS — Z79899 Other long term (current) drug therapy: Secondary | ICD-10-CM | POA: Insufficient documentation

## 2014-06-06 MED ORDER — CLINDAMYCIN HCL 300 MG PO CAPS: 300.0000 mg | ORAL_CAPSULE | Freq: Four times a day (QID) | ORAL | Status: DC

## 2014-06-06 NOTE — ED Notes (Signed)
Pt presented with breast pain. Pt stated that she had a mass on her right breast at the beginning of this year. The pt stated that she was seen in the ED and had it checked out and she received a referral to have her right breast drained and had it drained in July. Then earlier this month the patient stated "the mass started to grow back." Pt stated that the pain started 2 days ago in her right breast. Pt denies any other symptoms.

## 2014-06-06 NOTE — Discharge Instructions (Signed)
Take antibiotic to completion. Apply warm compresses. Follow up with the breast clinic and OB/GYN.  Cellulitis Cellulitis is an infection of the skin and the tissue beneath it. The infected area is usually red and tender. Cellulitis occurs most often in the arms and lower legs.  CAUSES  Cellulitis is caused by bacteria that enter the skin through cracks or cuts in the skin. The most common types of bacteria that cause cellulitis are staphylococci and streptococci. SIGNS AND SYMPTOMS   Redness and warmth.  Swelling.  Tenderness or pain.  Fever. DIAGNOSIS  Your health care provider can usually determine what is wrong based on a physical exam. Blood tests may also be done. TREATMENT  Treatment usually involves taking an antibiotic medicine. HOME CARE INSTRUCTIONS   Take your antibiotic medicine as directed by your health care provider. Finish the antibiotic even if you start to feel better.  Keep the infected arm or leg elevated to reduce swelling.  Apply a warm cloth to the affected area up to 4 times per day to relieve pain.  Take medicines only as directed by your health care provider.  Keep all follow-up visits as directed by your health care provider. SEEK MEDICAL CARE IF:   You notice red streaks coming from the infected area.  Your red area gets larger or turns dark in color.  Your bone or joint underneath the infected area becomes painful after the skin has healed.  Your infection returns in the same area or another area.  You notice a swollen bump in the infected area.  You develop new symptoms.  You have a fever. SEEK IMMEDIATE MEDICAL CARE IF:   You feel very sleepy.  You develop vomiting or diarrhea.  You have a general ill feeling (malaise) with muscle aches and pains. MAKE SURE YOU:   Understand these instructions.  Will watch your condition.  Will get help right away if you are not doing well or get worse. Document Released: 05/06/2005 Document  Revised: 12/11/2013 Document Reviewed: 10/12/2011 [facility] Patient Information 2015 [place], [place]. This information is not intended to replace advice given to you by your health care provider. Make sure you discuss any questions you have with your health care provider.

## 2014-06-06 NOTE — ED Provider Notes (Signed)
CSN: [id number]     Arrival date & time 06/06/14  1531 History   First MD Initiated Contact with Patient 06/06/14 1532     Chief Complaint  Patient presents with  . Breast Pain     (Consider location/radiation/quality/duration/timing/severity/associated sxs/prior Treatment) HPI Comments: This is a 23 year old female with a past medical history of breast cyst incision and drainage July 2015, asthma, eczema, MRSA and fibrocystic breasts who presents to the emergency department complaining of right-sided breast pain, redness and swelling 5 days. Patient reports she had a ultrasound-guided drainage of her right breast at the breast center in July after she was referred from the emergency department. On chart review, patient was seen at the emergency department, then in the MAU, from the MAU she was referred over to the breast center. States the area is tender. After the drainage, she was given ibuprofen. Prior to drainage, she was prescribed doxycycline. Denies fevers or discharge.  The history is provided by the patient.    Past Medical History  Diagnosis Date  . Asthma   . Eczema   . MRSA (methicillin resistant Staphylococcus aureus)   . Fibrocystic breast    Past Surgical History  Procedure Laterality Date  . No past surgeries    . Breast cyst incision and drainage  02/07/14   History reviewed. No pertinent family history. History  Substance Use Topics  . Smoking status: Current Every Day Smoker -- 0.50 packs/day for 5 years    Types: Cigars  . Smokeless tobacco: Never Used  . Alcohol Use: No   OB History   Grav Para Term Preterm Abortions TAB SAB Ect Mult Living   0              Review of Systems  10 Systems reviewed and are negative for acute change except as noted in the HPI.  Allergies  Sulfa antibiotics  Home Medications   Prior to Admission medications   Medication Sig Start Date End Date Taking? Authorizing Provider  albuterol (PROVENTIL HFA;VENTOLIN HFA)  108 (90 BASE) MCG/ACT inhaler Inhale 2 puffs into the lungs every 6 (six) hours as needed for wheezing.   Yes Historical Provider, MD  ibuprofen (ADVIL,MOTRIN) 200 MG tablet Take 400 mg by mouth every 6 (six) hours as needed. For pain   Yes Historical Provider, MD  ibuprofen (ADVIL,MOTRIN) 800 MG tablet Take 1 tablet (800 mg total) by mouth 3 (three) times daily. 01/21/14  Yes April K Palumbo-Rasch, MD  clindamycin (CLEOCIN) 300 MG capsule Take 1 capsule (300 mg total) by mouth 4 (four) times daily. X 7 days 06/06/14   Kathrynn Speedobyn M Dex Blakely, PA-C  doxycycline (VIBRAMYCIN) 50 MG capsule Take 1 capsule (50 mg total) by mouth 2 (two) times daily. 01/21/14   April K Palumbo-Rasch, MD   BP 121/66  Pulse 94  Temp(Src) 99.1 F (37.3 C) (Oral)  Resp 16  Ht 5\' 7"  (1.702 m)  Wt 133 lb (60.328 kg)  BMI 20.83 kg/m2  SpO2 98%  LMP 05/20/2014 Physical Exam  Nursing note and vitals reviewed. Constitutional: She is oriented to person, place, and time. She appears well-developed and well-nourished. No distress.  HENT:  Head: Normocephalic and atraumatic.  Mouth/Throat: Oropharynx is clear and moist.  Eyes: Conjunctivae are normal.  Neck: Normal range of motion. Neck supple.  Cardiovascular: Normal rate, regular rhythm and normal heart sounds.   Pulmonary/Chest: Effort normal and breath sounds normal.    Abdominal: Soft. Bowel sounds are normal. There is no tenderness.  Musculoskeletal: Normal range of motion. She exhibits no edema.  Lymphadenopathy:    She has no axillary adenopathy.  Neurological: She is alert and oriented to person, place, and time.  Skin: Skin is warm and dry. She is not diaphoretic.  Psychiatric: She has a normal mood and affect. Her behavior is normal.    ED Course  Procedures (including critical care time) Labs Review Labs Reviewed - No data to display  Imaging Review No results found.   EKG Interpretation None      MDM   Final diagnoses:  Abscess of right breast    Pt with right breast abscess, hx of the same. AFVSS. Area of induration as outlined in PE, no streaking or adenopathy. No drainage. Had this drained in the past. She called breast center and was told she needed a referral. Will start pt on clinda, f/u with breast center and ob/gyn. Stable for d/c. Return precautions given. Patient states understanding of treatment care plan and is agreeable.   Kathrynn SpeedRobyn M Sonakshi Rolland, PA-C 06/06/14 1606

## 2014-06-06 NOTE — ED Provider Notes (Signed)
Medical screening examination/treatment/procedure(s) were performed by non-physician practitioner and as supervising physician I was immediately available for consultation/collaboration.   EKG Interpretation None        Warnell Foresterrey Merryl Buckels, MD 06/06/14 1824

## 2016-02-11 IMAGING — US US BREAST LTD UNI RIGHT INC AXILLA
1 series · 11 of 11 positions shown · non-contrast
Comparison: None

CLINICAL DATA: Patient has a tender right palpable mass. Patient
reports no significant improvement after a course of doxycycline and
ibuprofen.

EXAM:
ULTRASOUND OF THE RIGHT BREAST

[Series 1: us breast ltd uni right inc axilla · 11 of 11 slices shown]
[im 1/11]
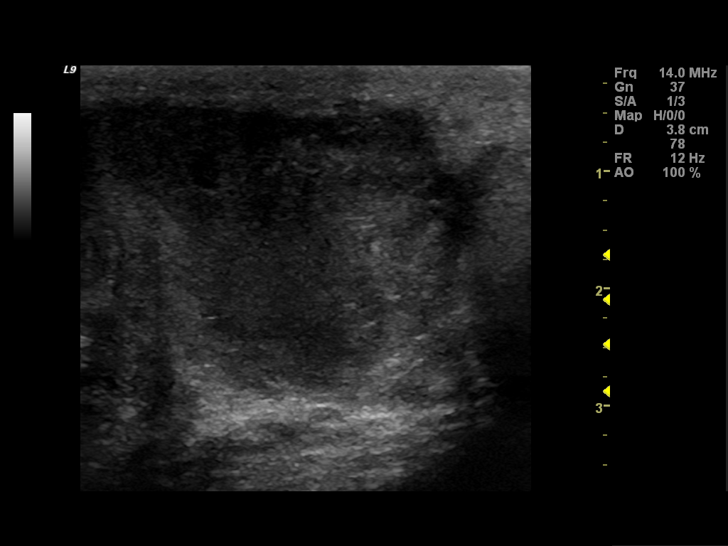
[im 2/11]
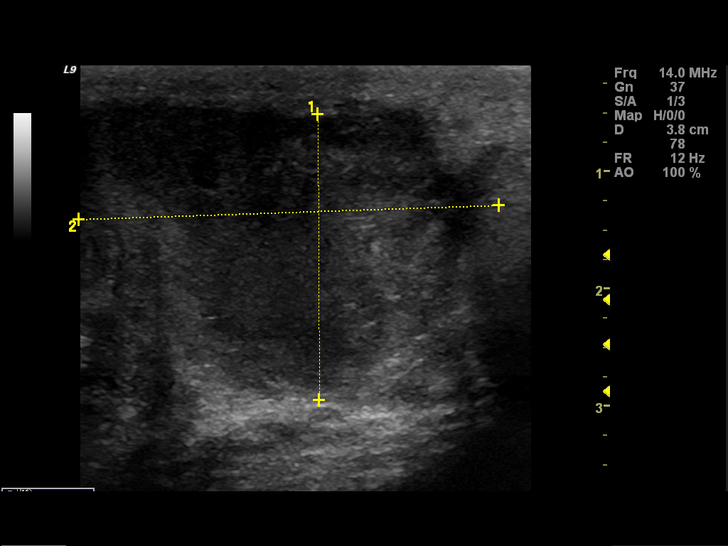
[im 3/11]
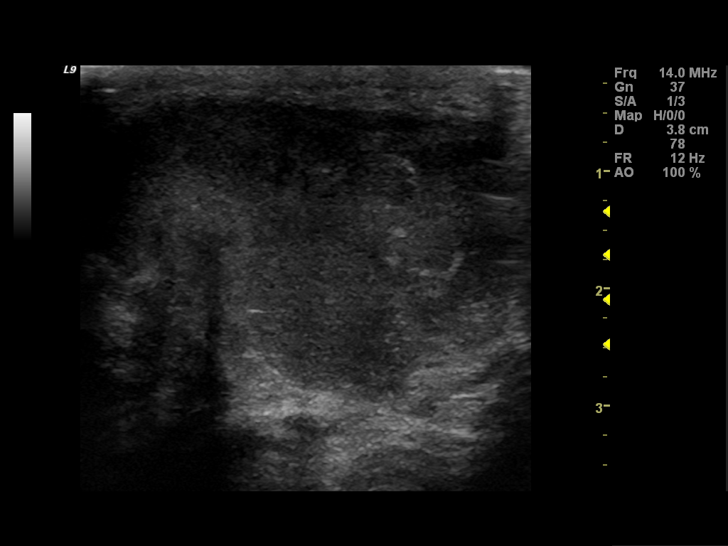
[im 4/11]
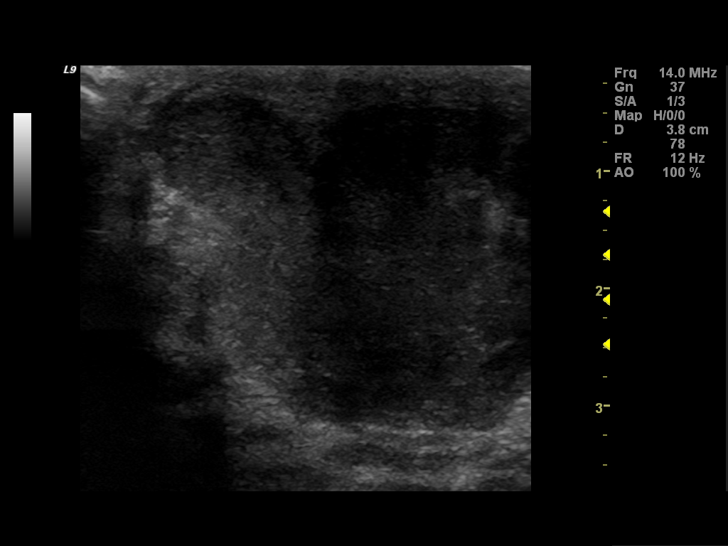
[im 5/11]
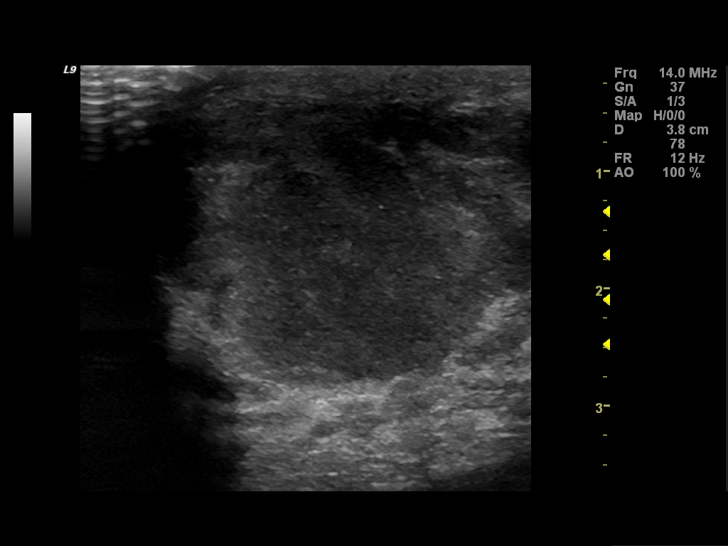
[im 6/11]
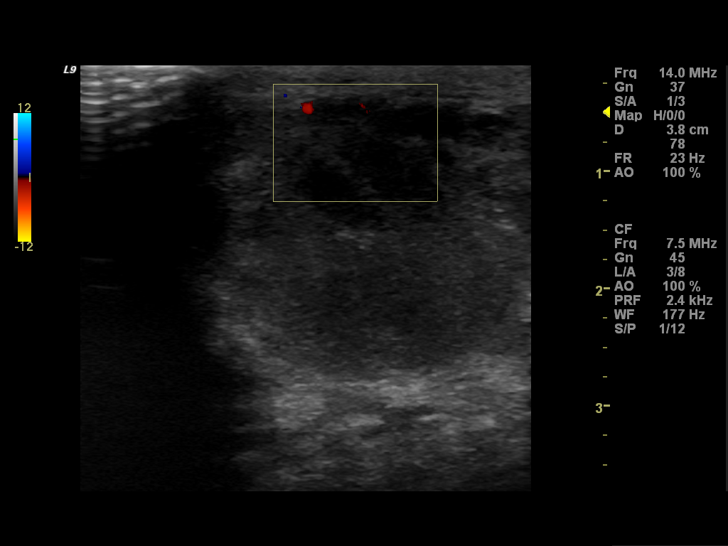
[im 7/11]
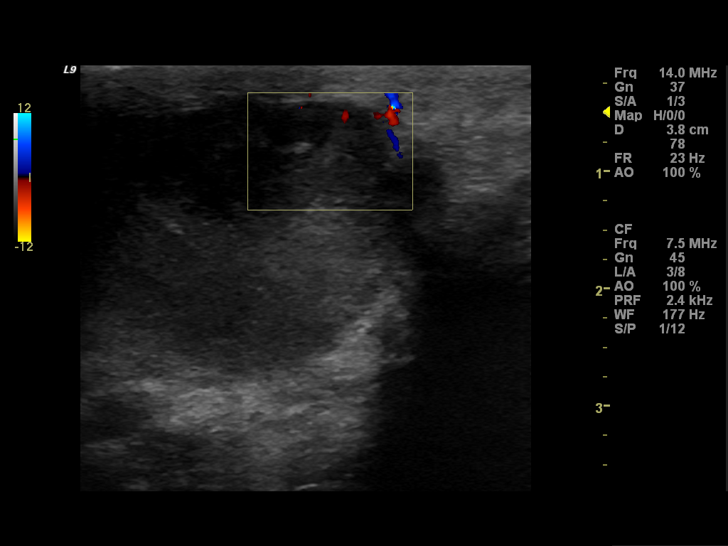
[im 8/11]
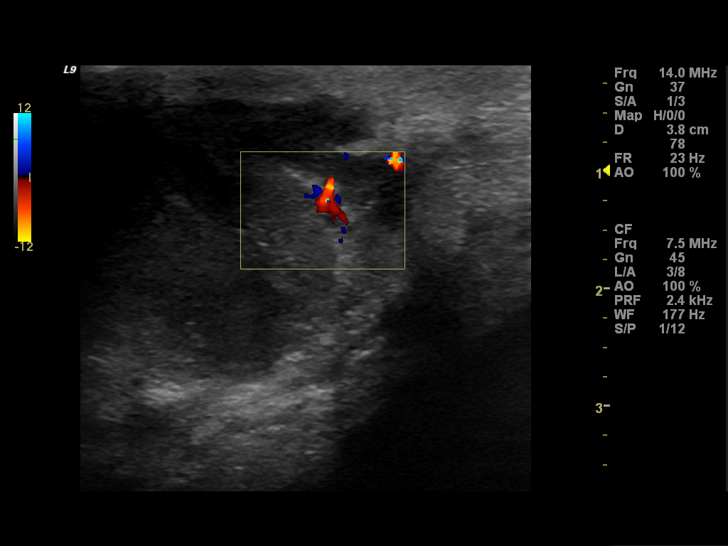
[im 9/11]
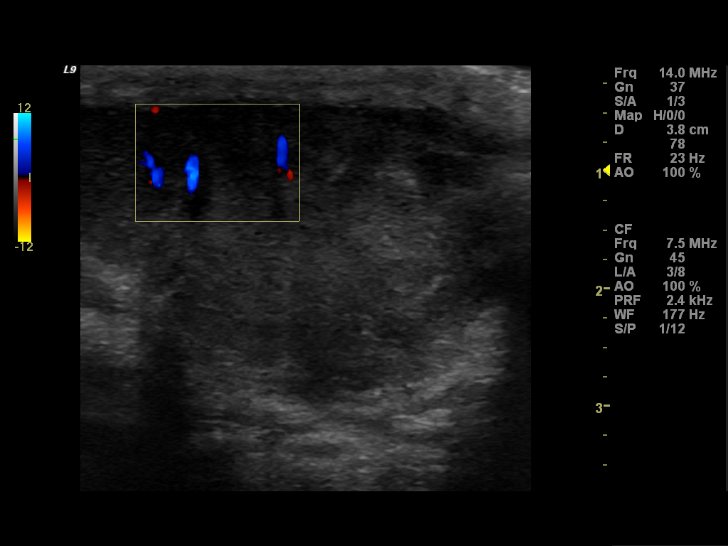
[im 10/11]
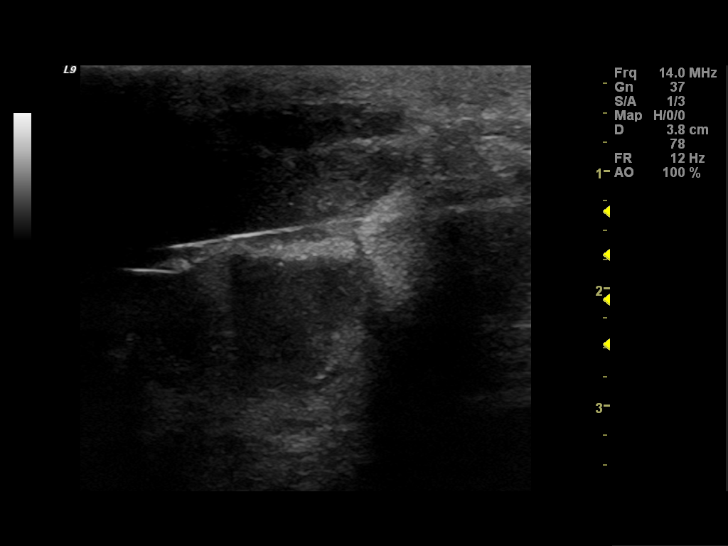
[im 11/11]
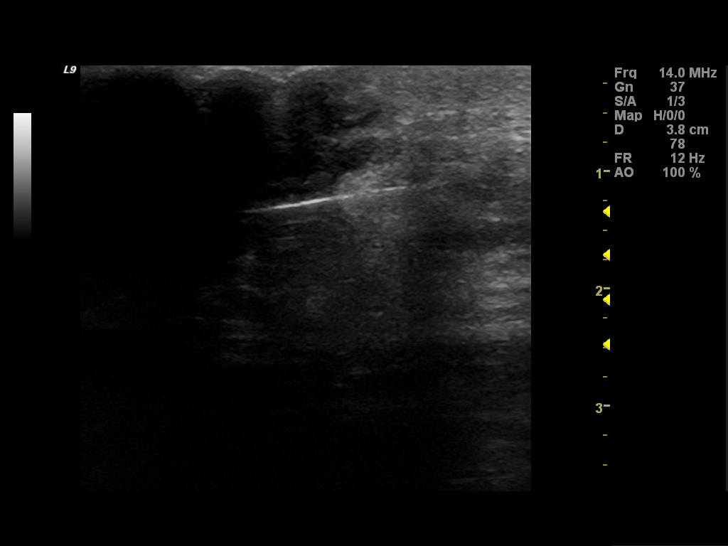

[11 of 11 positions shown; findings below may reference images not displayed]

FINDINGS: On physical exam, there is an erythematous tender mass in the
retroareolar region of the right breast.

Ultrasound is performed, showing an irregular hypoechoic fluid
collection in the retroareolar region of the right breast. The
periphery of the mass and portions of the cavity show internal blood
flow. Abscess measures approximately 2.4 x 3.6 cm.
IMPRESSION: Findings are consistent with the retroareolar right breast abscess.
We discussed the options of aspiration versus Surgical consultation
regarding incision and drainage.

RECOMMENDATION:
Ultrasound-guided aspiration is recommended. This is performed on
the same day and is dictated separately.

I have discussed the findings and recommendations with the patient.
Results were also provided in writing at the conclusion of the
visit. If applicable, a reminder letter will be sent to the patient
regarding the next appointment.

BI-RADS CATEGORY  2: Benign.

## 2016-02-25 IMAGING — US US BREAST LTD UNI RIGHT INC AXILLA
1 series · 3 of 3 positions shown · non-contrast
Comparison: 02/08/2014

CLINICAL DATA: Followup for right breast abscess. Culture grew
Proteus mirabilis. Patient has completed a 10 day course of
doxycycline and is feeling better. She reports continued mild
tenderness but significant overall improvement. She reports no
fevers.

EXAM:
ULTRASOUND OF THE RIGHT BREAST

[Series 2: us breast ltd uni right inc axilla · 3 of 3 slices shown]
[im 1/3]
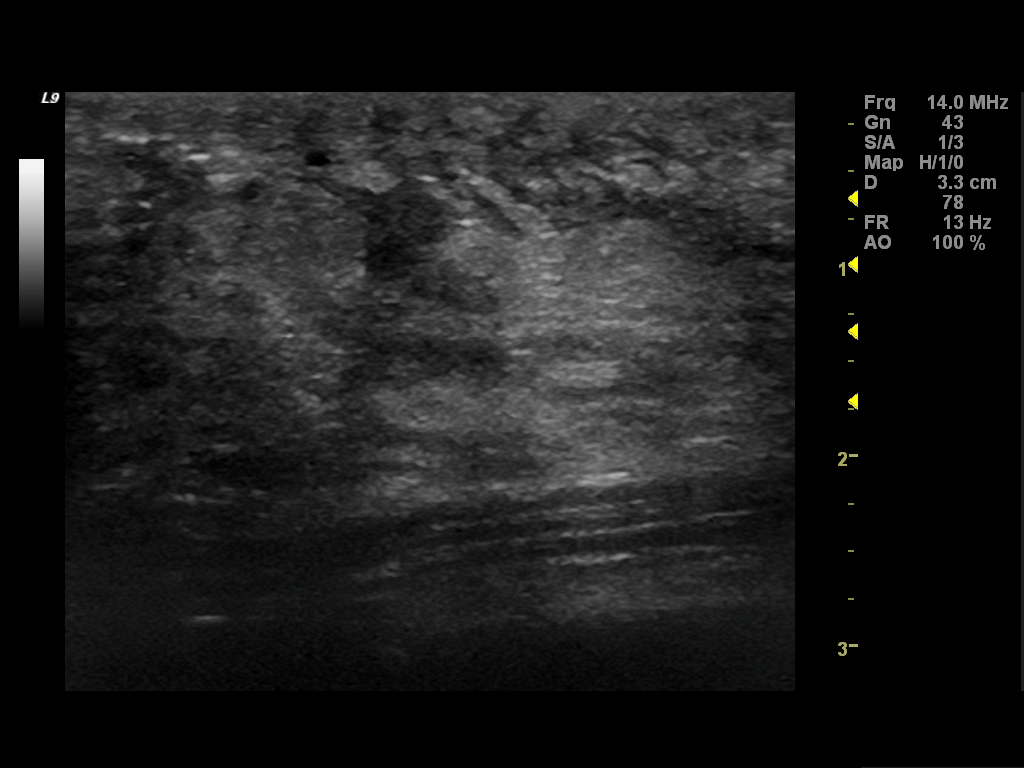
[im 2/3]
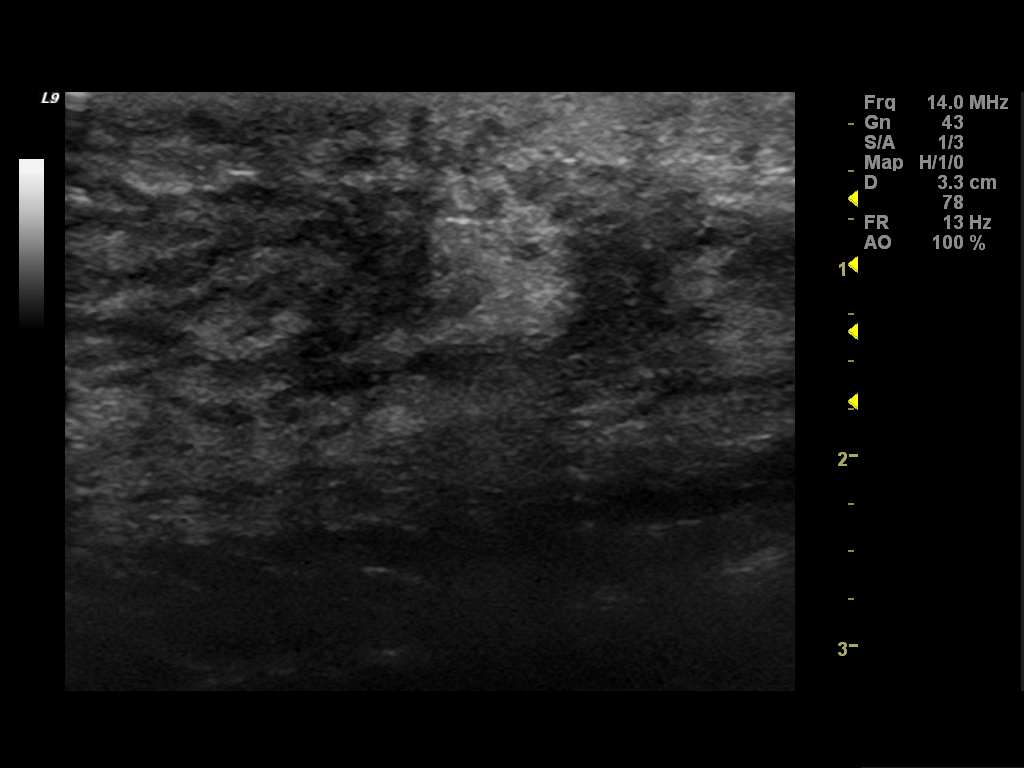
[im 3/3]
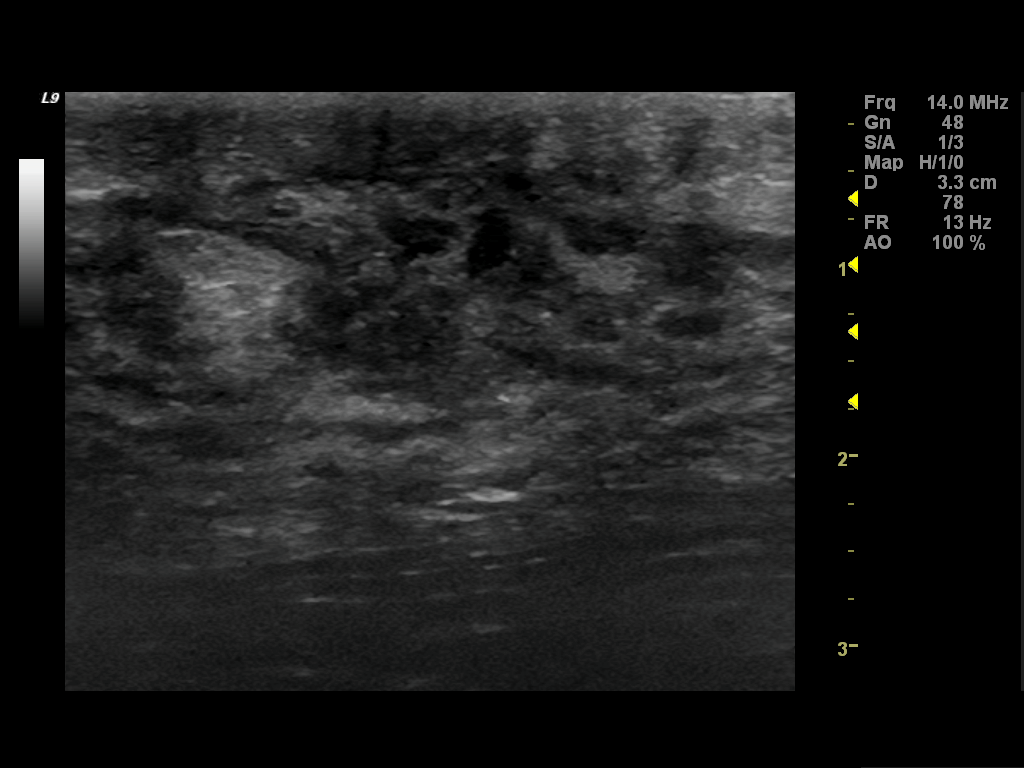

[3 of 3 positions shown; findings below may reference images not displayed]

FINDINGS: On physical exam, there is persistent mild thickening in the
retroareolar region of the right breast. Fullness and erythema have
completely resolved. Small healing puncture site is identified in
the 1 o'clock location following previous drainage.

Ultrasound is performed, showing residual skin thickening and
parenchymal changes in the area of previous abscess. However, no
significant residual fluid collection is identified. No suspicious
mass identified.
IMPRESSION: 1. Significant improvement following previous drainage and interval
course of antibiotics.
2. No evidence for malignancy.
3. Tylenol is recommended as needed for mild tenderness.

RECOMMENDATION:
Clinical followup is recommended. Imaging can be performed if
needed.

Screening mammogram at age 40 unless there are persistent or
intervening clinical concerns. (Code:QI-E-R44)

I have discussed the findings and recommendations with the patient.
Results were also provided in writing at the conclusion of the
visit. If applicable, a reminder letter will be sent to the patient
regarding the next appointment.

BI-RADS CATEGORY  2: Benign.

## 2019-06-27 ENCOUNTER — Encounter (HOSPITAL_BASED_OUTPATIENT_CLINIC_OR_DEPARTMENT_OTHER): Payer: Self-pay | Admitting: Emergency Medicine

## 2019-06-27 ENCOUNTER — Other Ambulatory Visit: Payer: Self-pay

## 2019-06-27 ENCOUNTER — Emergency Department (HOSPITAL_BASED_OUTPATIENT_CLINIC_OR_DEPARTMENT_OTHER)
Admission: EM | Admit: 2019-06-27 | Discharge: 2019-06-27 | Disposition: A | Discharge status: Home / Self Care | Payer: Medicaid Other | Attending: Emergency Medicine | Admitting: Emergency Medicine

## 2019-06-27 DIAGNOSIS — L03011 Cellulitis of right finger: Secondary | ICD-10-CM | POA: Diagnosis not present

## 2019-06-27 DIAGNOSIS — F17210 Nicotine dependence, cigarettes, uncomplicated: Secondary | ICD-10-CM | POA: Diagnosis not present

## 2019-06-27 DIAGNOSIS — J45909 Unspecified asthma, uncomplicated: Secondary | ICD-10-CM | POA: Diagnosis not present

## 2019-06-27 DIAGNOSIS — R2241 Localized swelling, mass and lump, right lower limb: Secondary | ICD-10-CM | POA: Diagnosis present

## 2019-06-27 MED ORDER — LIDOCAINE HCL 2 % IJ SOLN
5.0000 mL | Freq: Once | INTRAMUSCULAR | Status: AC
  Administered 2019-06-27: 02:00:00 100 mg via INTRADERMAL
  Filled 2019-06-27: qty 20

## 2019-06-27 NOTE — ED Notes (Signed)
ED Provider at bedside. 

## 2019-06-27 NOTE — ED Triage Notes (Signed)
Pt is c/o pain and swelling to her middle finger on her right hand  Denies injury

## 2019-06-27 NOTE — ED Provider Notes (Signed)
[facility] EMERGENCY DEPARTMENT Provider Note  CSN: [id number] Arrival date & time: 06/27/19 0103  Chief Complaint(s) Hand Pain  HPI Heather Clayton is a 28 y.o. female with h/o MRSA here for right middle finger tip swelling and pain for 2 days. Gradually worsening. Pain worse with palpation. No alleviating factor. No trauma. No fevers or chills. No redness.   HPI  Past Medical History Past Medical History:  Diagnosis Date   Asthma    Eczema    Fibrocystic breast    MRSA (methicillin resistant Staphylococcus aureus)    There are no active problems to display for this patient.  Home Medication(s) Prior to Admission medications   Medication Sig Start Date End Date Taking? Authorizing Provider  albuterol (PROVENTIL HFA;VENTOLIN HFA) 108 (90 BASE) MCG/ACT inhaler Inhale 2 puffs into the lungs every 6 (six) hours as needed for wheezing.    [provider]  clindamycin (CLEOCIN) 300 MG capsule Take 1 capsule (300 mg total) by mouth 4 (four) times daily. X 7 days 06/06/14   Hess, Nada Boozerobyn M, PA-C  doxycycline (VIBRAMYCIN) 50 MG capsule Take 1 capsule (50 mg total) by mouth 2 (two) times daily. 01/21/14   Palumbo, April, MD  ibuprofen (ADVIL,MOTRIN) 200 MG tablet Take 400 mg by mouth every 6 (six) hours as needed. For pain    [provider]  ibuprofen (ADVIL,MOTRIN) 800 MG tablet Take 1 tablet (800 mg total) by mouth 3 (three) times daily. 01/21/14   Palumbo, April, MD                                                                                                                                    Past Surgical History Past Surgical History:  Procedure Laterality Date   BREAST CYST INCISION AND DRAINAGE  02/07/14   NO PAST SURGERIES     Family History Family History  Problem Relation Age of Onset   Hypertension Mother    Cancer Mother    Cancer Other     Social History Social History   Tobacco Use   Smoking status: Current Some Day Smoker    Packs/day: 0.50    Years: 5.00    Pack years: 2.50    Types: Cigars   Smokeless tobacco: Never Used  Substance Use Topics   Alcohol use: No   Drug use: Yes    Types: Marijuana    Comment: last used feb 2015   Allergies Sulfa antibiotics  Review of Systems Review of Systems As noted in HPI Physical Exam Vital Signs  I have reviewed the triage vital signs BP 129/74 (BP Location: Right Arm)    Pulse (!) 53    Temp 98.9 F (37.2 C) (Oral)    Resp 16    Ht 5\' 7"  (1.702 m)    Wt 77.1 kg    LMP 06/24/2019 (Exact Date)    SpO2 99%    BMI 26.63 kg/m  Physical Exam Vitals signs reviewed.  Constitutional:      General: She is not in acute distress.    Appearance: She is well-developed. She is not diaphoretic.  HENT:     Head: Normocephalic and atraumatic.     Right Ear: External ear normal.     Left Ear: External ear normal.     Nose: Nose normal.  Eyes:     General: No scleral icterus.    Conjunctiva/sclera: Conjunctivae normal.  Neck:     Musculoskeletal: Normal range of motion.     Trachea: Phonation normal.  Cardiovascular:     Rate and Rhythm: Normal rate and regular rhythm.  Pulmonary:     Effort: Pulmonary effort is normal. No respiratory distress.     Breath sounds: No stridor.  Abdominal:     General: There is no distension.  Musculoskeletal: Normal range of motion.     Right hand: She exhibits tenderness and swelling. Normal sensation noted.       Hands:  Neurological:     Mental Status: She is alert and oriented to person, place, and time.  Psychiatric:        Behavior: Behavior normal.     ED Results and Treatments Labs (all labs ordered are listed, but only abnormal results are displayed) Labs Reviewed - No data to display                                                                                                                       EKG  EKG Interpretation  Date/Time:    Ventricular Rate:    PR Interval:    QRS Duration:   QT  Interval:    QTC Calculation:   R Axis:     Text Interpretation:        Radiology No results found.  Pertinent labs & imaging results that were available during my care of the patient were reviewed by me and considered in my medical decision making (see chart for details).  Medications Ordered in ED Medications  lidocaine (XYLOCAINE) 2 % (with pres) injection 100 mg (has no administration in time range)                                                                                                                                    Procedures Drain paronychia  Date/Time: 06/27/2019 1:31 AM Performed by: Nira Conn, MD Authorized by: Eudelia Bunch,  Grayce Sessions, MD  Consent: Verbal consent obtained. Consent given by: patient Patient understanding: patient states understanding of the procedure being performed Patient consent: the patient's understanding of the procedure matches consent given Patient identity confirmed: verbally with patient Preparation: Patient was prepped and draped in the usual sterile fashion. Local anesthesia used: yes Anesthesia: digital block  Anesthesia: Local anesthesia used: yes Local Anesthetic: lidocaine 2% without epinephrine  Sedation: Patient sedated: no  Patient tolerance: patient tolerated the procedure well with no immediate complications     (including critical care time)  Medical Decision Making / ED Course I have reviewed the nursing notes for this encounter and the patient's prior records (if available in EHR or on provided paperwork).   Heather Clayton was evaluated in Emergency Department on 06/27/2019 for the symptoms described in the history of present illness. She was evaluated in the context of the global COVID-19 pandemic, which necessitated consideration that the patient might be at risk for infection with the SARS-CoV-2 virus that causes COVID-19. Institutional protocols and algorithms that pertain to the evaluation of  patients at risk for COVID-19 are in a state of rapid change based on information released by regulatory bodies including the CDC and federal and state organizations. These policies and algorithms were followed during the patient's care in the ED.  epinichya  Paronychia. Nerve block and I&D. Dressed. No Abx needed.  The patient appears reasonably screened and/or stabilized for discharge and I doubt any other medical condition or other [facility] requiring further screening, evaluation, or treatment in the ED at this time prior to discharge.  The patient is safe for discharge with strict return precautions.       Final Clinical Impression(s) / ED Diagnoses Final diagnoses:  Paronychia of right middle finger     The patient appears reasonably screened and/or stabilized for discharge and I doubt any other medical condition or other [facility] requiring further screening, evaluation, or treatment in the ED at this time prior to discharge.  Disposition: Discharge  Condition: Good  I have discussed the results, Dx and Tx plan with the patient who expressed understanding and agree(s) with the plan. Discharge instructions discussed at great length. The patient was given strict return precautions who verbalized understanding of the instructions. No further questions at time of discharge.    ED Discharge Orders    None       Follow Up: Glory Buff, MD [place] [id number] [phone]  Call  As needed     This chart was dictated using voice recognition software.  Despite best efforts to proofread,  errors can occur which can change the documentation meaning.   Fatima Blank, MD 06/27/19 [phone]

## 2019-09-09 ENCOUNTER — Other Ambulatory Visit: Payer: Self-pay

## 2019-09-09 ENCOUNTER — Encounter (HOSPITAL_BASED_OUTPATIENT_CLINIC_OR_DEPARTMENT_OTHER): Payer: Self-pay | Admitting: Emergency Medicine

## 2019-09-09 ENCOUNTER — Emergency Department (HOSPITAL_BASED_OUTPATIENT_CLINIC_OR_DEPARTMENT_OTHER)
Admission: EM | Admit: 2019-09-09 | Discharge: 2019-09-09 | Disposition: A | Discharge status: Home / Self Care | Payer: Medicaid Other | Attending: Emergency Medicine | Admitting: Emergency Medicine

## 2019-09-09 DIAGNOSIS — F17210 Nicotine dependence, cigarettes, uncomplicated: Secondary | ICD-10-CM | POA: Diagnosis not present

## 2019-09-09 DIAGNOSIS — Z882 Allergy status to sulfonamides status: Secondary | ICD-10-CM | POA: Diagnosis not present

## 2019-09-09 DIAGNOSIS — J45909 Unspecified asthma, uncomplicated: Secondary | ICD-10-CM | POA: Diagnosis not present

## 2019-09-09 DIAGNOSIS — L02512 Cutaneous abscess of left hand: Secondary | ICD-10-CM | POA: Diagnosis present

## 2019-09-09 MED ORDER — LIDOCAINE HCL 1 % IJ SOLN
INTRAMUSCULAR | Status: AC
  Filled 2019-09-09: qty 20

## 2019-09-09 MED ORDER — LIDOCAINE HCL (PF) 1 % IJ SOLN
5.0000 mL | Freq: Once | INTRAMUSCULAR | Status: AC
  Administered 2019-09-09: 5 mL
  Filled 2019-09-09: qty 5

## 2019-09-09 MED ORDER — DOXYCYCLINE HYCLATE 100 MG PO CAPS: 100.0000 mg | ORAL_CAPSULE | Freq: Two times a day (BID) | ORAL | 0 refills | Status: DC

## 2019-09-09 MED ORDER — DOXYCYCLINE HYCLATE 100 MG PO CAPS: 100.0000 mg | ORAL_CAPSULE | Freq: Two times a day (BID) | ORAL | 0 refills | Status: AC

## 2019-09-09 NOTE — Discharge Instructions (Signed)
Please read and follow all provided instructions.  Your diagnoses today include:  1. Abscess of finger of left hand     Tests performed today include:  Vital signs. See below for your results today.   Medications prescribed:   Doxycycline - antibiotic  You have been prescribed an antibiotic medicine: take the entire course of medicine even if you are feeling better. Stopping early can cause the antibiotic not to work.  Take any prescribed medications only as directed.   Home care instructions:   Follow any educational materials contained in this packet  Follow-up instructions: Return to the Emergency Department in 48 hours for a recheck if your symptoms are not significantly improved.  Please follow-up with your primary care provider in the next 1 week for further evaluation of your symptoms.   Return instructions:  Return to the Emergency Department if you have:  Fever  Worsening symptoms  Worsening pain  Worsening swelling  Redness of the skin that moves away from the affected area, especially if it streaks away from the affected area   Any other emergent concerns  Your vital signs today were: BP 132/66 (BP Location: Left Arm)   Pulse 75   Temp 98.6 F (37 C) (Oral)   Resp 18   Ht 5\' 7"  (1.702 m)   Wt 77.1 kg   LMP 09/02/2019   SpO2 100%   BMI 26.62 kg/m  If your blood pressure (BP) was elevated above 135/85 this visit, please have this repeated by your doctor within one month. --------------

## 2019-09-09 NOTE — ED Provider Notes (Signed)
[facility] Provider Note   CSN: [id number] Arrival date & time: 09/09/19  2140     History Chief Complaint  Patient presents with  . Abscess    Finger    Heather Clayton is a 29 y.o. female.  Patient presents to the emergency department with complaint of abscess to the left short finger which developed about 5 days ago and has become worse.  Area is mildly tender.  Pain worse with movement.  No numbness or tingling.  She denies injury to the area.  Onset of symptoms acute.  No treatments prior to arrival.        Past Medical History:  Diagnosis Date  . Asthma   . Eczema   . Fibrocystic breast   . MRSA (methicillin resistant Staphylococcus aureus)     There are no problems to display for this patient.   Past Surgical History:  Procedure Laterality Date  . BREAST CYST INCISION AND DRAINAGE  02/07/14  . NO PAST SURGERIES       OB History    Gravida  0   Para      Term      Preterm      AB      Living        SAB      TAB      Ectopic      Multiple      Live Births              Family History  Problem Relation Age of Onset  . Hypertension Mother   . Cancer Mother   . Cancer Other     Social History   Tobacco Use  . Smoking status: Current Some Day Smoker    Packs/day: 0.50    Years: 5.00    Pack years: 2.50    Types: Cigars  . Smokeless tobacco: Never Used  Substance Use Topics  . Alcohol use: No  . Drug use: Yes    Types: Marijuana    Comment: last used feb 2015    Home Medications Prior to Admission medications   Medication Sig Start Date End Date Taking? Authorizing Provider  albuterol (PROVENTIL HFA;VENTOLIN HFA) 108 (90 BASE) MCG/ACT inhaler Inhale 2 puffs into the lungs every 6 (six) hours as needed for wheezing.    [provider]  doxycycline (VIBRAMYCIN) 100 MG capsule Take 1 capsule (100 mg total) by mouth 2 (two) times daily. 09/09/19   Carlisle Cater, PA-C  ibuprofen  (ADVIL,MOTRIN) 200 MG tablet Take 400 mg by mouth every 6 (six) hours as needed. For pain    [provider]  ibuprofen (ADVIL,MOTRIN) 800 MG tablet Take 1 tablet (800 mg total) by mouth 3 (three) times daily. 01/21/14   Palumbo, April, MD    Allergies    Sulfa antibiotics  Review of Systems   Review of Systems  Constitutional: Negative for fever.  Gastrointestinal: Negative for nausea and vomiting.  Skin: Negative for color change.       Positive for abscess.  Hematological: Negative for adenopathy.    Physical Exam Updated Vital Signs BP 132/66 (BP Location: Left Arm)   Pulse 75   Temp 98.6 F (37 C) (Oral)   Resp 18   Ht 5\' 7"  (1.702 m)   Wt 77.1 kg   LMP 09/02/2019   SpO2 100%   BMI 26.62 kg/m   Physical Exam Vitals and nursing note reviewed.  Constitutional:  Appearance: She is well-developed.  HENT:     Head: Normocephalic and atraumatic.  Eyes:     Conjunctiva/sclera: Conjunctivae normal.  Pulmonary:     Effort: No respiratory distress.  Musculoskeletal:     Cervical back: Normal range of motion and neck supple.     Comments:    Skin:    General: Skin is warm and dry.     Comments: L short finger: 1cm firm area overlying the volar aspect of the finger at the level of the PIP joint.  Area is mildly tender swelling, and fluctuant.  Neurological:     Mental Status: She is alert.     ED Results / Procedures / Treatments   Labs (all labs ordered are listed, but only abnormal results are displayed) Labs Reviewed - No data to display  EKG None  Radiology No results found.  Procedures .Marland KitchenIncision and Drainage  Date/Time: 09/09/2019 10:25 PM Performed by: Renne Crigler, PA-C Authorized by: Renne Crigler, PA-C   Consent:    Consent obtained:  Verbal   Consent given by:  Patient   Risks discussed:  Bleeding, damage to other organs, infection, pain and incomplete drainage   Alternatives discussed:  No treatment Location:    Type:   Abscess   Size:  1cm   Location:  Upper extremity   Upper extremity location:  Finger   Finger location:  L small finger Pre-procedure details:    Skin preparation:  Betadine Anesthesia (see MAR for exact dosages):    Anesthesia method:  Local infiltration   Local anesthetic:  Lidocaine 1% w/o epi Procedure type:    Complexity:  Simple Procedure details:    Incision types:  Stab incision   Scalpel blade:  11   Drainage:  Purulent   Drainage amount:  Moderate   Wound treatment:  Wound left open Post-procedure details:    Patient tolerance of procedure:  Tolerated well, no immediate complications   (including critical care time)  Medications Ordered in ED Medications  lidocaine (XYLOCAINE) 1 % (with pres) injection (  Not Given 09/09/19 2202)  lidocaine (PF) (XYLOCAINE) 1 % injection 5 mL (5 mLs Infiltration Given by Other 09/09/19 2206)    ED Course  I have reviewed the triage vital signs and the nursing notes.  Pertinent labs & imaging results that were available during my care of the patient were reviewed by me and considered in my medical decision making (see chart for details).  Patient seen and examined. Work-up initiated. Medications ordered.   Vital signs reviewed and are as follows: BP 132/66 (BP Location: Left Arm)   Pulse 75   Temp 98.6 F (37 C) (Oral)   Resp 18   Ht 5\' 7"  (1.702 m)   Wt 77.1 kg   LMP 09/02/2019   SpO2 100%   BMI 26.62 kg/m   Pt agrees to proceed with I&D after discussion of risks and benefits.  I&D performed as above without complications.  Fair amount of purulent fluid expressed given size of abscess.  10:26 PM The patient was urged to return to the Emergency Department urgently with worsening pain, swelling, expanding erythema especially if it streaks away from the affected area, fever, or if they have any other concerns.  Discussed antibiotics.  Doxycycline ordered.  Encouraged warm soaks at home.  The patient was urged to return to  the Emergency Department or go to their PCP in 48 hours for wound recheck if the area is not significantly improved.  The  patient verbalized understanding and stated agreement with this plan.     MDM Rules/Calculators/A&P                      Cutaneous finger abscess as above.  I&D performed without complications.  Given location on finger, overlying joint, discussed antibiotics with patient and will give 7 days of doxycycline.   Final Clinical Impression(s) / ED Diagnoses Final diagnoses:  Abscess of finger of left hand    Rx / DC Orders ED Discharge Orders         Ordered    doxycycline (VIBRAMYCIN) 100 MG capsule  2 times daily     09/09/19 2219           Renne Crigler, PA-C 09/09/19 2228    Milagros Loll, MD 09/10/19 [phone]

## 2019-09-09 NOTE — ED Triage Notes (Signed)
Pt states that she has had swelling and pain to her left anterior pinky x 5 days

## 2019-09-13 ENCOUNTER — Other Ambulatory Visit: Payer: Self-pay

## 2019-09-13 ENCOUNTER — Emergency Department (HOSPITAL_BASED_OUTPATIENT_CLINIC_OR_DEPARTMENT_OTHER)
Admission: EM | Admit: 2019-09-13 | Discharge: 2019-09-13 | Disposition: A | Discharge status: Home / Self Care | Payer: Medicaid Other | Attending: Emergency Medicine | Admitting: Emergency Medicine

## 2019-09-13 DIAGNOSIS — F17290 Nicotine dependence, other tobacco product, uncomplicated: Secondary | ICD-10-CM | POA: Insufficient documentation

## 2019-09-13 DIAGNOSIS — J45909 Unspecified asthma, uncomplicated: Secondary | ICD-10-CM | POA: Insufficient documentation

## 2019-09-13 DIAGNOSIS — L02512 Cutaneous abscess of left hand: Secondary | ICD-10-CM | POA: Diagnosis not present

## 2019-09-13 MED ORDER — DOXYCYCLINE HYCLATE 100 MG PO TABS
100.0000 mg | ORAL_TABLET | Freq: Once | ORAL | Status: AC
  Administered 2019-09-13: 100 mg via ORAL
  Filled 2019-09-13: qty 1

## 2019-09-13 MED ORDER — HYDROCODONE-ACETAMINOPHEN 5-325 MG PO TABS: 1.0000 | ORAL_TABLET | ORAL | 0 refills | Status: AC | PRN

## 2019-09-13 MED ORDER — ONDANSETRON 4 MG PO TBDP: 4.0000 mg | ORAL_TABLET | Freq: Four times a day (QID) | ORAL | 0 refills | Status: AC | PRN

## 2019-09-13 MED ORDER — ONDANSETRON 4 MG PO TBDP
4.0000 mg | ORAL_TABLET | Freq: Once | ORAL | Status: AC
  Administered 2019-09-13: 4 mg via ORAL
  Filled 2019-09-13: qty 1

## 2019-09-13 MED ORDER — ACETAMINOPHEN 500 MG PO TABS
1000.0000 mg | ORAL_TABLET | Freq: Once | ORAL | Status: AC
  Administered 2019-09-13: 1000 mg via ORAL
  Filled 2019-09-13: qty 2

## 2019-09-13 NOTE — Discharge Instructions (Signed)
I recommend that you continue to soak your finger in warm water at least 3-4 times a day for 15 minutes at a time.  Please start your doxycycline in the morning.  You were given your first dose here in the emergency department.  You may apply a clean, dry dressing once a day.  You may apply over-the-counter triple antibiotic ointment or Neosporin to this area once a day.  If you begin developing fever of 100.4 or higher, increasing pain or swelling of your hand, vomiting and cannot keep your medication down, please return to the emergency department.

## 2019-09-13 NOTE — ED Triage Notes (Signed)
Pt seen here sat for  same , c/o increased swelling redness, pt state she has not started taking the doxy

## 2019-09-13 NOTE — ED Provider Notes (Signed)
TIME SEEN: 1:21 AM  CHIEF COMPLAINT: Fifth finger abscess  HPI: Patient is a right-hand-dominant 29 year old female with history of MRSA who presents to the emergency department with left fifth finger abscess.  Patient was seen on 09/09/2019 for the same and underwent incision and drainage.  Was discharged on doxycycline which she states she has picked up from the pharmacy but has not started taking.  She is unable to tell me why she has not started this medication.  She is not a diabetic.  No history of HIV.  No fevers or vomiting.  States the area seems to be more swollen and painful.  ROS: See HPI Constitutional: no fever  Eyes: no drainage  ENT: no runny nose   Cardiovascular:  no chest pain  Resp: no SOB  GI: no vomiting GU: no dysuria Integumentary: no rash  Allergy: no hives  Musculoskeletal: no leg swelling  Neurological: no slurred speech ROS otherwise negative  PAST MEDICAL HISTORY/PAST SURGICAL HISTORY:  Past Medical History:  Diagnosis Date  . Asthma   . Eczema   . Fibrocystic breast   . MRSA (methicillin resistant Staphylococcus aureus)     MEDICATIONS:  Prior to Admission medications   Medication Sig Start Date End Date Taking? Authorizing Provider  albuterol (PROVENTIL HFA;VENTOLIN HFA) 108 (90 BASE) MCG/ACT inhaler Inhale 2 puffs into the lungs every 6 (six) hours as needed for wheezing.    [provider]  doxycycline (VIBRAMYCIN) 100 MG capsule Take 1 capsule (100 mg total) by mouth 2 (two) times daily. 09/09/19   Renne Crigler, PA-C  ibuprofen (ADVIL,MOTRIN) 200 MG tablet Take 400 mg by mouth every 6 (six) hours as needed. For pain    [provider]  ibuprofen (ADVIL,MOTRIN) 800 MG tablet Take 1 tablet (800 mg total) by mouth 3 (three) times daily. 01/21/14   Palumbo, April, MD    ALLERGIES:  Allergies  Allergen Reactions  . Sulfa Antibiotics Itching    SOCIAL HISTORY:  Social History   Tobacco Use  . Smoking status: Current Some Day  Smoker    Packs/day: 0.50    Years: 5.00    Pack years: 2.50    Types: Cigars  . Smokeless tobacco: Never Used  Substance Use Topics  . Alcohol use: No    FAMILY HISTORY: Family History  Problem Relation Age of Onset  . Hypertension Mother   . Cancer Mother   . Cancer Other     EXAM: BP 112/60   Pulse 73   Temp 98.8 F (37.1 C) (Oral)   Resp 16   Ht 5\' 7"  (1.702 m)   Wt 77 kg   LMP 09/02/2019   SpO2 100%   BMI 26.59 kg/m  CONSTITUTIONAL: Alert and oriented and responds appropriately to questions. Well-appearing; well-nourished HEAD: Normocephalic EYES: Conjunctivae clear, pupils appear equal, EOM appear intact ENT: normal nose; moist mucous membranes NECK: Supple, normal ROM CARD: RRR; S1 and S2 appreciated; no murmurs, no clicks, no rubs, no gallops RESP: Normal chest excursion without splinting or tachypnea; breath sounds clear and equal bilaterally; no wheezes, no rhonchi, no rales, no hypoxia or respiratory distress, speaking full sentences ABD/GI: Normal bowel sounds; non-distended; soft, non-tender, no rebound, no guarding, no peritoneal signs, no hepatosplenomegaly BACK:  The back appears normal EXT: Normal ROM in all joints; no deformity noted, no edema; no cyanosis; patient has a 2 x 1 cm area of fluctuance with purulent drainage in the center to the proximal aspect of the palmar portion of  the left fifth digit.  There is diffuse swelling of this digit but no significant redness, warmth and no tenderness over the flexor tendon.  She has full range of motion at the distal part of the finger but is limited more proximally secondary to swelling.  She has 2+ left radial pulse on exam.  Otherwise left hand appears normal.  Normal capillary refill. SKIN: Normal color for age and race; warm; no rash on exposed skin NEURO: Moves all extremities equally PSYCH: The patient's mood and manner are appropriate.   MEDICAL DECISION MAKING: Patient here with abscess to the fifth  digit of the left hand.  She reports she has not been taking her antibiotics as prescribed.  She has not been soaking her hand.  Abscess still appears to be draining.  I was able to express a significant amount of pus from this area and with that her pain and swelling improved significantly.  Wound culture has been sent.  She was previously prescribed doxycycline which I feel is appropriate given she has a history of MRSA.  Given her first dose here in the ED.  Also prescribed pain and nausea medicine.  There is no sign of any flexor tenosynovitis today.  No significant hand cellulitis.  No systemic symptoms.  No herpetic whitlow or felon.  She is neurovascularly intact on exam.  We have soaked her finger here for 20 minutes and have recommended she continue this several times a day as well as gentle expression of any purulent material from the opening that was made on 1/30.  She does not need repeat I&D today as she is draining well.  Strongly encouraged her to start taking her antibiotics.  We discussed at length return precautions.  I do not feel at this time she needs admission for IV antibiotics or emergent hand surgery evaluation.  She is comfortable with this plan.  At this time, I do not feel there is any life-threatening condition present. I have reviewed, interpreted and discussed all results (EKG, imaging, lab, urine as appropriate) and exam findings with patient/family. I have reviewed nursing notes and appropriate previous records.  I feel the patient is safe to be discharged home without further emergent workup and can continue workup as an outpatient as needed. Discussed usual and customary return precautions. Patient/family verbalize understanding and are comfortable with this plan.  Outpatient follow-up has been provided as needed. All questions have been answered.      Heather Clayton was evaluated in Emergency Department on 09/13/2019 for the symptoms described in the history of present  illness. She was evaluated in the context of the global COVID-19 pandemic, which necessitated consideration that the patient might be at risk for infection with the SARS-CoV-2 virus that causes COVID-19. Institutional protocols and algorithms that pertain to the evaluation of patients at risk for COVID-19 are in a Clayton of rapid change based on information released by regulatory bodies including the CDC and federal and Clayton organizations. These policies and algorithms were followed during the patient's care in the ED.  Patient was seen wearing N95, face shield, gloves.    Heather Clayton, Delice Bison, DO 09/13/19 [phone]

## 2019-09-15 LAB — AEROBIC CULTURE W GRAM STAIN (SUPERFICIAL SPECIMEN)

## 2019-09-17 ENCOUNTER — Telehealth: Payer: Self-pay | Admitting: Emergency Medicine

## 2019-09-17 NOTE — Telephone Encounter (Signed)
Post ED Visit - Positive Culture Follow-up  Culture report reviewed by antimicrobial stewardship pharmacist: [facility] []  , Pharm.D. []  Enzo Bi, Pharm.D., BCPS AQ-ID []  , Pharm.D., BCPS []  Celedonio Miyamoto, Pharm.D., BCPS []  [place], Garvin Fila.D., BCPS, AAHIVP []  , Pharm.D., BCPS, AAHIVP []  Georgina Pillion, PharmD, BCPS []  , PharmD, BCPS []  [place], PharmD, BCPS [x]  [place], PharmD []  , PharmD, BCPS []  Estella Husk, PharmD  Pharmacy Team []  Lysle Pearl, PharmD []  , PharmD []  Phillips Climes, PharmD []  , Rph []  Agapito Games) , PharmD []  Daylene Posey, PharmD []  , PharmD []  Mervyn Gay, PharmD []  , PharmD []  Vinnie Level, PharmD []  Wonda Olds, PharmD []  , PharmD []  Len Childs, PharmD   Positive aerobic culture Treated with Doxcycline, organism sensitive to the same and no further patient follow-up is required at this time.  Heather Clayton 09/17/2019, 6:24 PM

## 2023-02-01 ENCOUNTER — Encounter: Payer: Medicaid Other | Admitting: Obstetrics and Gynecology

## 2023-03-26 ENCOUNTER — Encounter: Payer: Medicaid Other | Admitting: Obstetrics and Gynecology

## 2023-05-06 ENCOUNTER — Encounter: Payer: Medicaid Other | Admitting: Family Medicine

## 2023-05-25 ENCOUNTER — Encounter: Payer: Self-pay | Admitting: Family Medicine

## 2023-05-25 ENCOUNTER — Ambulatory Visit (INDEPENDENT_AMBULATORY_CARE_PROVIDER_SITE_OTHER): Payer: Medicaid Other | Admitting: Family Medicine

## 2023-05-25 VITALS — BP 118/51 | HR 46 | Wt 150.0 lb

## 2023-05-25 DIAGNOSIS — Z3046 Encounter for surveillance of implantable subdermal contraceptive: Secondary | ICD-10-CM

## 2023-05-25 NOTE — Progress Notes (Unsigned)
    SUBJECTIVE:   CHIEF COMPLAINT / HPI:   Nexplanon Removal  Patient is presenting for evaluation for Nexplanon removal.  Is not interested in birth control at this time.  Is interested in getting pregnant in the near future.  Nexplanon has been in for 3 years.  No concerns or issues at this time.   OBJECTIVE:   BP (!) 118/51   Pulse (!) 46   Wt 150 lb (68 kg)   BMI 23.49 kg/m   General: Alert, oriented, well-appearing Cardiac: Asymptomatic bradycardia Respiratory: Normal work of breathing, speaking in full sentences MSK: Palpable nexplanon in left arm    Nexplanon Removal Patient identified, informed consent performed, consent signed.   Appropriate time out taken. Nexplanon site identified.  Area prepped in usual sterile fashon. One ml of 1% lidocaine was used to anesthetize the area at the distal end of the implant. A small stab incision was made right beside the implant on the distal portion.  The Nexplanon rod was grasped using hemostats and removed without difficulty.  There was minimal blood loss. There were no complications.  3 ml of 1% lidocaine was injected around the incision for post-procedure analgesia.  Steri-strips were applied over the small incision.  A pressure bandage was applied to reduce any bruising.  The patient tolerated the procedure well and was given post procedure instructions.  Patient is not planning on any contraception at this time.   ASSESSMENT/PLAN:   Nexplanon removal Nexplanon removed without difficulty.  Patient declines any formal contraceptive discussed time.  Discussed use of prenatal vitamins and condoms if not desiring to get pregnant right now.  Patient understanding.  No further questions or concerns.     Celedonio Savage, MD [facility]

## 2023-05-27 DIAGNOSIS — Z3046 Encounter for surveillance of implantable subdermal contraceptive: Secondary | ICD-10-CM | POA: Insufficient documentation

## 2023-05-27 NOTE — Assessment & Plan Note (Signed)
Nexplanon removed without difficulty.  Patient declines any formal contraceptive discussed time.  Discussed use of prenatal vitamins and condoms if not desiring to get pregnant right now.  Patient understanding.  No further questions or concerns.
